# Patient Record
Sex: Male | Born: 1990 | Race: White | Hispanic: No | Marital: Single | Smoking: Former smoker
Health system: Southern US, Community
[De-identification: ages and names within clinical notes are randomized; demographics above are authoritative.]

## PROBLEM LIST (undated history)

## (undated) DIAGNOSIS — G43909 Migraine, unspecified, not intractable, without status migrainosus: Secondary | ICD-10-CM

## (undated) HISTORY — PX: KNEE SURGERY: SHX244

---
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
## 2014-11-05 ENCOUNTER — Ambulatory Visit
Admit: 2014-11-05 | Disposition: A | Discharge status: Home / Self Care | Payer: Self-pay | Attending: Family Medicine | Admitting: Family Medicine

## 2016-02-13 ENCOUNTER — Emergency Department: Payer: BLUE CROSS/BLUE SHIELD

## 2016-02-13 ENCOUNTER — Emergency Department
Admission: EM | Admit: 2016-02-13 | Discharge: 2016-02-13 | Disposition: A | Discharge status: Home / Self Care | Payer: BLUE CROSS/BLUE SHIELD | Attending: Emergency Medicine | Admitting: Emergency Medicine

## 2016-02-13 ENCOUNTER — Encounter: Payer: Self-pay | Admitting: Medical Oncology

## 2016-02-13 DIAGNOSIS — S161XXA Strain of muscle, fascia and tendon at neck level, initial encounter: Secondary | ICD-10-CM | POA: Insufficient documentation

## 2016-02-13 DIAGNOSIS — S60410A Abrasion of right index finger, initial encounter: Secondary | ICD-10-CM | POA: Diagnosis not present

## 2016-02-13 DIAGNOSIS — Y999 Unspecified external cause status: Secondary | ICD-10-CM | POA: Diagnosis not present

## 2016-02-13 DIAGNOSIS — Y9389 Activity, other specified: Secondary | ICD-10-CM | POA: Diagnosis not present

## 2016-02-13 DIAGNOSIS — Y92410 Unspecified street and highway as the place of occurrence of the external cause: Secondary | ICD-10-CM | POA: Insufficient documentation

## 2016-02-13 DIAGNOSIS — M7918 Myalgia, other site: Secondary | ICD-10-CM

## 2016-02-13 DIAGNOSIS — M542 Cervicalgia: Secondary | ICD-10-CM | POA: Diagnosis present

## 2016-02-13 MED ORDER — IBUPROFEN 600 MG PO TABS: 600.0000 mg | ORAL_TABLET | Freq: Three times a day (TID) | ORAL | 0 refills | Status: DC | PRN

## 2016-02-13 MED ORDER — METHOCARBAMOL 750 MG PO TABS: 750.0000 mg | ORAL_TABLET | Freq: Four times a day (QID) | ORAL | 0 refills | Status: DC

## 2016-02-13 MED ORDER — KETOROLAC TROMETHAMINE 60 MG/2ML IM SOLN
60.0000 mg | Freq: Once | INTRAMUSCULAR | Status: AC
  Administered 2016-02-13: 60 mg via INTRAMUSCULAR
  Filled 2016-02-13: qty 2

## 2016-02-13 MED ORDER — HYDROMORPHONE HCL 1 MG/ML IJ SOLN
1.0000 mg | Freq: Once | INTRAMUSCULAR | Status: AC
  Administered 2016-02-13: 1 mg via INTRAMUSCULAR
  Filled 2016-02-13: qty 1

## 2016-02-13 MED ORDER — BACITRACIN ZINC 500 UNIT/GM EX OINT: TOPICAL_OINTMENT | Freq: Two times a day (BID) | CUTANEOUS | Status: DC

## 2016-02-13 MED ORDER — TRAMADOL HCL 50 MG PO TABS: 50.0000 mg | ORAL_TABLET | Freq: Four times a day (QID) | ORAL | 0 refills | Status: AC | PRN
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
## 2016-02-13 NOTE — Discharge Instructions (Signed)
Weight thumb splint for 3-5 days as needed.

## 2016-02-13 NOTE — ED Notes (Signed)
Pt returned from Xray and CT.  
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
## 2023-10-31 ENCOUNTER — Other Ambulatory Visit: Payer: Self-pay | Admitting: Family Medicine

## 2023-10-31 DIAGNOSIS — M25561 Pain in right knee: Secondary | ICD-10-CM

## 2023-10-31 NOTE — Progress Notes (Signed)
 Chief Complaint  Patient presents with  . Leg Pain    Right leg.     Patient is agreeable to [employer] scribe.   History of Present Illness Garrett Costa is a 33 year old male with a history of knee surgeries who presents with right knee instability and pain following a fall.  He experienced a fall from a height of approximately 15 to 20 feet, resulting in right knee instability and pain. He does not recall the exact mechanism of injury to the knee but suspects landing on his right leg, as he is experiencing significant issues with his right knee and ankle.  He has a history of knee surgeries, including a meniscus repair and ACL reconstruction on the right knee. The current sensation is similar to previous instability experienced before his surgeries. The knee feels unstable, particularly when attempting to lift it, and it buckles outward. He is unable to perform certain activities such as washing his feet or putting on underwear without assistance due to the instability.  He visited the emergency room after a fall where x-rays and a CT scan of the head, neck, chest, abdomen, and pelvis were performed, which showed no significant findings. An MRI was not conducted due to unavailability. He was given a knee brace for stability.  He is currently taking ibuprofen  for pain management but prefers to avoid narcotics due to a history of substance use disorder, having been clean for two years. The ibuprofen  is somewhat effective, but he is open to trying stronger non-narcotic anti-inflammatories.  He also reports soreness and stiffness in the right ankle, which he believes to be sprained. No numbness or nerve pain in the foot or leg, although he initially described a sensation of cramping in the foot, which he attributes to swelling.  He mentions bruising on the right side of his body, which he associates with the fall, but denies any significant pain in the ribs or abdomen. The bruising is likely due to  the harness he was wearing during the fall.  When reviewing ER notes he did obtain CT head, cervical spine, chest, abdomen, and pelvis which were all reassuring.  X-rays of the knee and ankle were reassuring.  No x-ray of the tibia noted.    ROS  Review of systems is unremarkable for any active cardiac, respiratory, GI, GU, hematologic, neurologic, dermatologic, HEENT, or psychiatric symptoms except as noted above.  No fevers, chills, or constitutional symptoms.   Current Outpatient Medications  Medication Sig Dispense Refill  . acetaminophen  (TYLENOL ) 325 MG tablet Take 650 mg by mouth every 4 (four) hours as needed for Pain    . meloxicam (MOBIC) 15 MG tablet Take 1 tablet (15 mg total) by mouth once daily for 14 days 14 tablet 0   No current facility-administered medications for this visit.    Allergies as of 10/31/2023 - Reviewed 10/31/2023  Allergen Reaction Noted  . Topamax [topiramate] Nausea 11/14/2017    Patient Active Problem List  Diagnosis  . ADD (attention deficit disorder)  . Migraine with aura and without status migrainosus, not intractable  . Polysubstance abuse (CMS/HHS-HCC)    Past Medical History:  Diagnosis Date  . ADD (attention deficit disorder) 04/21/2014  . Migraine with aura and without status migrainosus, not intractable 09/01/2017    Past Surgical History:  Procedure Laterality Date  . finger surgery Left 2012  . KNEE SURGERY     x3  . wisdom teeth      Vitals:  10/31/23 1314  BP: 118/82  Pulse: 75  SpO2: 98%  Weight: (!) 142.4 kg (314 lb)  Height: 188 cm (6' 2)  PainSc: 10-Worst pain ever   Body mass index is 40.32 kg/m.  Exam BP 118/82 (BP Location: Left upper arm, Patient Position: Sitting, BP Cuff Size: Adult)   Pulse 75   Ht 188 cm (6' 2)   Wt (!) 142.4 kg (314 lb)   SpO2 98%   BMI 40.32 kg/m   General. Well appearing; NAD; VS reviewed     Eyes. Sclera and conjunctiva clear Lungs. Respirations unlabored; clear to  auscultation bilaterally Cardiovascular. Heart regular rate and rhythm without murmurs, gallops, or rubs MSK: Patient has 2+ pulses in the posterior tibialis bilaterally.  Has full range of motion of the right ankle.  Patient is able to extend and flex the knee but with manual assistance of his hands.  When palpating the knee there is some mild effusion noted on the joint line but no significant tenderness.  Anterior and posterior drawer test appears to be intact but does elicit some discomfort.  Some tenderness over the lateral and medial portions of the knee.  No significant bruising noted.  No notable instability with valgus and varus movement.  Patient has full range of motion of hip of the right leg. Skin. Normal color and turgor Neurologic. Alert and oriented x3 Assessment & Plan  Right knee instability and pain Right knee instability and pain following a fall from a height of 15-20 feet. ACL and meniscus repairs on the right knee. The knee is unstable, buckling, and unable to fully flex without assistance. Significant swelling present. Differential diagnosis includes possible meniscus tear, ACL injury, or hairline fracture. MRI is warranted to assess for structural damage. He prefers to avoid narcotics due to substance use disorder and is managing pain with ibuprofen . - Order MRI of the right knee to assess for structural damage. - Advise wearing a knee brace for stability until MRI results are available. - Switch from ibuprofen  to meloxicam for pain management. - Avoid opioids due to substance use disorder. - Consider lidocaine  patch if pain persists.  Right ankle sprain Right ankle sprain with soreness and stiffness following the same fall. X-rays at the emergency room showed no fractures. The ankle is sore but not as severe as the knee. Plan to rule out any missed fractures with further imaging. - Order x-ray of the tibia and fibula to rule out any missed fractures. - Continue with  anti-inflammatory medication for pain management.    F/U: Patient follow-up to be determined based on MRI.  JASON HESTLE WHITAKER, PA  This note has been created using automated tools and reviewed for accuracy by JASON HESTLE WHITAKER.   Note: This dictation was prepared with Dragon dictation along with smaller phrase technology. Any transcriptional errors that result from this process are unintentional.

## 2023-11-04 ENCOUNTER — Ambulatory Visit (HOSPITAL_COMMUNITY)
Admission: RE | Admit: 2023-11-04 | Discharge: 2023-11-04 | Disposition: A | Discharge status: Home / Self Care | Payer: Self-pay | Source: Ambulatory Visit | Attending: Family Medicine | Admitting: Family Medicine

## 2023-11-04 DIAGNOSIS — M25561 Pain in right knee: Secondary | ICD-10-CM | POA: Insufficient documentation
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
## 2024-02-12 ENCOUNTER — Other Ambulatory Visit: Payer: Self-pay

## 2024-02-12 ENCOUNTER — Other Ambulatory Visit: Payer: Self-pay | Admitting: Orthopedic Surgery

## 2024-02-12 ENCOUNTER — Encounter (HOSPITAL_COMMUNITY): Payer: Self-pay | Admitting: Orthopedic Surgery

## 2024-02-12 ENCOUNTER — Encounter (HOSPITAL_COMMUNITY): Payer: Self-pay

## 2024-02-12 ENCOUNTER — Inpatient Hospital Stay (HOSPITAL_COMMUNITY)
Admission: RE | Admit: 2024-02-12 | Discharge: 2024-02-18 | DRG: 486 | Disposition: A | Discharge status: Home Health | Payer: Worker's Compensation | Source: Ambulatory Visit | Attending: Orthopedic Surgery | Admitting: Orthopedic Surgery

## 2024-02-12 DIAGNOSIS — Z888 Allergy status to other drugs, medicaments and biological substances status: Secondary | ICD-10-CM

## 2024-02-12 DIAGNOSIS — S83511S Sprain of anterior cruciate ligament of right knee, sequela: Secondary | ICD-10-CM | POA: Diagnosis not present

## 2024-02-12 DIAGNOSIS — Y832 Surgical operation with anastomosis, bypass or graft as the cause of abnormal reaction of the patient, or of later complication, without mention of misadventure at the time of the procedure: Secondary | ICD-10-CM | POA: Diagnosis present

## 2024-02-12 DIAGNOSIS — Z9889 Other specified postprocedural states: Secondary | ICD-10-CM | POA: Diagnosis present

## 2024-02-12 DIAGNOSIS — E66813 Obesity, class 3: Secondary | ICD-10-CM | POA: Diagnosis present

## 2024-02-12 DIAGNOSIS — M009 Pyogenic arthritis, unspecified: Principal | ICD-10-CM | POA: Diagnosis present

## 2024-02-12 DIAGNOSIS — M00061 Staphylococcal arthritis, right knee: Secondary | ICD-10-CM | POA: Diagnosis present

## 2024-02-12 DIAGNOSIS — Z6841 Body Mass Index (BMI) 40.0 and over, adult: Secondary | ICD-10-CM | POA: Diagnosis not present

## 2024-02-12 DIAGNOSIS — T847XXA Infection and inflammatory reaction due to other internal orthopedic prosthetic devices, implants and grafts, initial encounter: Principal | ICD-10-CM | POA: Diagnosis present

## 2024-02-12 DIAGNOSIS — B958 Unspecified staphylococcus as the cause of diseases classified elsewhere: Secondary | ICD-10-CM

## 2024-02-12 DIAGNOSIS — B9689 Other specified bacterial agents as the cause of diseases classified elsewhere: Secondary | ICD-10-CM

## 2024-02-12 DIAGNOSIS — T8142XA Infection following a procedure, deep incisional surgical site, initial encounter: Secondary | ICD-10-CM | POA: Diagnosis present

## 2024-02-12 DIAGNOSIS — M00861 Arthritis due to other bacteria, right knee: Secondary | ICD-10-CM | POA: Diagnosis not present

## 2024-02-12 DIAGNOSIS — Z87891 Personal history of nicotine dependence: Secondary | ICD-10-CM

## 2024-02-12 DIAGNOSIS — Z969 Presence of functional implant, unspecified: Secondary | ICD-10-CM

## 2024-02-12 DIAGNOSIS — M1711 Unilateral primary osteoarthritis, right knee: Secondary | ICD-10-CM | POA: Diagnosis present

## 2024-02-12 HISTORY — DX: Migraine, unspecified, not intractable, without status migrainosus: G43.909

## 2024-02-12 LAB — CBC
HCT: 37.5 % — ABNORMAL LOW (ref 39.0–52.0)
Hemoglobin: 12 g/dL — ABNORMAL LOW (ref 13.0–17.0)
MCH: 29.6 pg (ref 26.0–34.0)
MCHC: 32 g/dL (ref 30.0–36.0)
MCV: 92.6 fL (ref 80.0–100.0)
Platelets: 533 K/uL — ABNORMAL HIGH (ref 150–400)
RBC: 4.05 MIL/uL — ABNORMAL LOW (ref 4.22–5.81)
RDW: 12.5 % (ref 11.5–15.5)
WBC: 9.1 K/uL (ref 4.0–10.5)
nRBC: 0 % (ref 0.0–0.2)

## 2024-02-12 LAB — COMPREHENSIVE METABOLIC PANEL WITH GFR
ALT: 56 U/L — ABNORMAL HIGH (ref 0–44)
AST: 36 U/L (ref 15–41)
Albumin: 3.1 g/dL — ABNORMAL LOW (ref 3.5–5.0)
Alkaline Phosphatase: 124 U/L (ref 38–126)
Anion gap: 12 (ref 5–15)
BUN: 14 mg/dL (ref 6–20)
CO2: 25 mmol/L (ref 22–32)
Calcium: 9.2 mg/dL (ref 8.9–10.3)
Chloride: 103 mmol/L (ref 98–111)
Creatinine, Ser: 0.92 mg/dL (ref 0.61–1.24)
GFR, Estimated: >60 mL/min (ref >60)
Glucose, Bld: 95 mg/dL (ref 70–99)
Potassium: 3.9 mmol/L (ref 3.5–5.1)
Sodium: 140 mmol/L (ref 135–145)
Total Bilirubin: 0.7 mg/dL (ref 0.0–1.2)
Total Protein: 8.1 g/dL (ref 6.5–8.1)

## 2024-02-12 LAB — SEDIMENTATION RATE: Sed Rate: 114 mm/h — ABNORMAL HIGH (ref 0–16)

## 2024-02-12 LAB — C-REACTIVE PROTEIN: CRP: 20.5 mg/dL — ABNORMAL HIGH (ref <1.0)

## 2024-02-12 MED ORDER — VANCOMYCIN HCL 2000 MG/400ML IV SOLN: 2000.0000 mg | Freq: Two times a day (BID) | INTRAVENOUS | Status: DC

## 2024-02-12 MED ORDER — VANCOMYCIN HCL 1500 MG/300ML IV SOLN
1500.0000 mg | Freq: Three times a day (TID) | INTRAVENOUS | Status: DC
  Administered 2024-02-13: 1500 mg via INTRAVENOUS
  Filled 2024-02-12 (×2): qty 300

## 2024-02-12 MED ORDER — OXYCODONE HCL 5 MG PO TABS
5.0000 mg | ORAL_TABLET | ORAL | Status: DC | PRN
  Administered 2024-02-12 – 2024-02-13 (×3): 10 mg via ORAL
  Filled 2024-02-12 (×3): qty 2

## 2024-02-12 MED ORDER — DOCUSATE SODIUM 100 MG PO CAPS
100.0000 mg | ORAL_CAPSULE | Freq: Two times a day (BID) | ORAL | Status: DC
  Administered 2024-02-12 – 2024-02-13 (×2): 100 mg via ORAL
  Filled 2024-02-12 (×3): qty 1

## 2024-02-12 MED ORDER — METHOCARBAMOL 1000 MG/10ML IJ SOLN: 500.0000 mg | Freq: Four times a day (QID) | INTRAMUSCULAR | Status: DC | PRN

## 2024-02-12 MED ORDER — SODIUM CHLORIDE 0.9 % IV SOLN: INTRAVENOUS | Status: DC

## 2024-02-12 MED ORDER — METHOCARBAMOL 500 MG PO TABS
500.0000 mg | ORAL_TABLET | Freq: Four times a day (QID) | ORAL | Status: DC | PRN
  Administered 2024-02-13 (×2): 500 mg via ORAL
  Filled 2024-02-12 (×2): qty 1

## 2024-02-12 MED ORDER — HYDROXYZINE HCL 25 MG PO TABS
25.0000 mg | ORAL_TABLET | Freq: Three times a day (TID) | ORAL | Status: DC
  Administered 2024-02-12 – 2024-02-18 (×26): 25 mg via ORAL
  Filled 2024-02-12 (×19): qty 1

## 2024-02-12 MED ORDER — SODIUM CHLORIDE 0.9 % IV SOLN
2.0000 g | Freq: Three times a day (TID) | INTRAVENOUS | Status: DC
  Administered 2024-02-12 – 2024-02-17 (×24): 2 g via INTRAVENOUS
  Filled 2024-02-12 (×17): qty 12.5

## 2024-02-12 MED ORDER — VANCOMYCIN HCL 10 G IV SOLR
2500.0000 mg | Freq: Once | INTRAVENOUS | Status: AC
  Administered 2024-02-12: 2500 mg via INTRAVENOUS
  Filled 2024-02-12: qty 10

## 2024-02-12 MED ORDER — ACETAMINOPHEN 325 MG PO TABS: 325.0000 mg | ORAL_TABLET | Freq: Four times a day (QID) | ORAL | Status: DC | PRN
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
## 2024-02-12 NOTE — Progress Notes (Addendum)
 Pharmacy Antibiotic Note  Garrett Costa is a 33 y.o. male admitted on 02/12/2024 with concerns for septic knee infection s/p ACL reconstruction on 01/05/24. Originally presented to [facility] ortho earlier today and underwent knee aspiration which revealed yellow colored joint fluid. Pharmacy has been consulted for Vancomycin  dosing.  Plan: - Cefepime  IV 2g Q8h - Vancomycin  plan 2500mg  x1, then 1500mg  q8h (eAUC 467, Scr rounded up to 0.8 (most recent was 0.7 on 02/09/24), IBW, Vd 0.5)  - Monitor renal function, clinical status - F/u plans for surgical intervention/source control, ability to de-escalate  Height: 6' 2 (188 cm) Weight: (!) 142.9 kg (315 lb) IBW/kg (Calculated) : 82.2  Temp (24hrs), Avg:98.5 F (36.9 C), Min:98.5 F (36.9 C), Max:98.5 F (36.9 C)  No results for input(s): WBC, CREATININE, LATICACIDVEN, VANCOTROUGH, VANCOPEAK, VANCORANDOM, GENTTROUGH, GENTPEAK, GENTRANDOM, TOBRATROUGH, TOBRAPEAK, TOBRARND, AMIKACINPEAK, AMIKACINTROU, AMIKACIN in the last 168 hours.  CrCl cannot be calculated (No successful lab value found.).    Allergies  Allergen Reactions   Topiramate Nausea Only    Antimicrobials this admission: Vanc/CFP 8/7>>  Dose adjustments this admission: none  Microbiology results: none  Thank you for allowing pharmacy to be a part of this patient's care.  Garrett Costa 02/12/2024 2:41 PM

## 2024-02-12 NOTE — H&P (Signed)
 Garrett Costa is an 33 y.o. male.   Chief Complaint: Right Knee Pain  HPI: Mr. Garrett Costa is a 33 year old male who is status post right knee redo ACL reconstruction utilizing a hamstring allograft through [employer] on 01/05/24.  Following this procedure the patient did have several aspirations of hemarthrosis and noticed continued swelling on the knee.  He reports that he has not been feeling well for the past 2 weeks.  He saw his primary care physician who did some blood work at a white blood cell count of 9K.  Patient reports he has had a fever up to 101.4 and his right knee is more painful and swollen today.  He presented to [facility] orthopedics earlier today where again the knee was aspirated unfortunately this time obtaining cloudy yellow colored joint fluid and there is concerns for a pyarthrosis.  He was asked to report to [facility] for admission and to have the knee washed out.  No past medical history on file.  Past Surgical History:  Procedure Laterality Date   KNEE SURGERY      No family history on file. Social History:  has no history on file for tobacco use, alcohol use, and drug use.  Allergies: No Known Allergies  No medications prior to admission.    No results found for this or any previous visit (from the past 48 hours). No results found.  Review of Systems  Constitutional:  Positive for fatigue and fever.  HENT: Negative.    Eyes: Negative.   Respiratory: Negative.    Cardiovascular: Negative.   Gastrointestinal: Negative.   Endocrine: Negative.   Genitourinary: Negative.   Musculoskeletal:  Positive for arthralgias, gait problem and joint swelling.  Allergic/Immunologic: Negative.   Hematological: Negative.   Psychiatric/Behavioral: Negative.      There were no vitals taken for this visit. Physical Exam Constitutional:      Appearance: Normal appearance. He is obese.  HENT:     Head: Normocephalic and atraumatic.     Nose: Nose normal.   Eyes:     Pupils: Pupils are equal, round, and reactive to light.  Cardiovascular:     Pulses: Normal pulses.  Pulmonary:     Effort: Pulmonary effort is normal.  Musculoskeletal:        General: Swelling and tenderness present.     Cervical back: Normal range of motion and neck supple.     Comments: He has a +2 effusion.  He has pain with motion.  Some warmth.  Surgical scars are benign.  Just a little erythema.  Skin:    General: Skin is warm and dry.  Neurological:     General: No focal deficit present.     Mental Status: He is alert and oriented to person, place, and time. Mental status is at baseline.  Psychiatric:        Mood and Affect: Mood normal.        Behavior: Behavior normal.        Thought Content: Thought content normal.        Judgment: Judgment normal.      Assessment/Plan Septic right knee in a patient with prior history of redo ACL reconstruction with hamstring allograft  This patient was discussed with Dr. Liam who also examined the patient and discussed options going forward.  At this point we do believe that a arthroscopic washout with possible removal of ACL graft and hardware would be indicated.  He will need IV  antibiotics and  we will consult infectious disease for definitive antibiotic treatment.  [name], PA-C 02/12/2024, 1:28 PM

## 2024-02-12 NOTE — Plan of Care (Signed)

## 2024-02-13 ENCOUNTER — Inpatient Hospital Stay (HOSPITAL_COMMUNITY): Payer: Worker's Compensation | Admitting: Anesthesiology

## 2024-02-13 ENCOUNTER — Encounter (HOSPITAL_COMMUNITY): Payer: Self-pay | Admitting: Orthopedic Surgery

## 2024-02-13 ENCOUNTER — Inpatient Hospital Stay (HOSPITAL_COMMUNITY): Admission: RE | Admit: 2024-02-13 | Source: Home / Self Care | Admitting: Orthopedic Surgery

## 2024-02-13 ENCOUNTER — Other Ambulatory Visit: Payer: Self-pay

## 2024-02-13 ENCOUNTER — Encounter (HOSPITAL_COMMUNITY)
Admission: RE | Disposition: A | Discharge status: Home Health | Payer: Self-pay | Source: Ambulatory Visit | Attending: Orthopedic Surgery

## 2024-02-13 DIAGNOSIS — M009 Pyogenic arthritis, unspecified: Secondary | ICD-10-CM

## 2024-02-13 DIAGNOSIS — E66813 Obesity, class 3: Secondary | ICD-10-CM

## 2024-02-13 DIAGNOSIS — Z6841 Body Mass Index (BMI) 40.0 and over, adult: Secondary | ICD-10-CM

## 2024-02-13 HISTORY — PX: KNEE ARTHROSCOPY: SHX127

## 2024-02-13 SURGERY — ARTHROSCOPY, KNEE
Anesthesia: General | Site: Knee | Laterality: Right

## 2024-02-13 MED ORDER — BISACODYL 5 MG PO TBEC: 5.0000 mg | DELAYED_RELEASE_TABLET | Freq: Every day | ORAL | Status: DC | PRN

## 2024-02-13 MED ORDER — HYDROGEN PEROXIDE 3 % EX SOLN
CUTANEOUS | Status: AC
  Filled 2024-02-13: qty 473

## 2024-02-13 MED ORDER — METOCLOPRAMIDE HCL 5 MG/ML IJ SOLN: 5.0000 mg | Freq: Three times a day (TID) | INTRAMUSCULAR | Status: DC | PRN

## 2024-02-13 MED ORDER — HYDROMORPHONE HCL 1 MG/ML IJ SOLN
0.5000 mg | INTRAMUSCULAR | Status: DC | PRN
  Administered 2024-02-13 – 2024-02-17 (×13): 1 mg via INTRAVENOUS
  Filled 2024-02-13 (×10): qty 1

## 2024-02-13 MED ORDER — KETOROLAC TROMETHAMINE 30 MG/ML IJ SOLN: 30.0000 mg | Freq: Once | INTRAMUSCULAR | Status: DC | PRN

## 2024-02-13 MED ORDER — METOCLOPRAMIDE HCL 5 MG PO TABS: 5.0000 mg | ORAL_TABLET | Freq: Three times a day (TID) | ORAL | Status: DC | PRN

## 2024-02-13 MED ORDER — PROPOFOL 10 MG/ML IV BOLUS
INTRAVENOUS | Status: AC
  Filled 2024-02-13: qty 20

## 2024-02-13 MED ORDER — LIDOCAINE HCL (PF) 2 % IJ SOLN
INTRAMUSCULAR | Status: AC
  Filled 2024-02-13: qty 5

## 2024-02-13 MED ORDER — AMISULPRIDE (ANTIEMETIC) 5 MG/2ML IV SOLN: 10.0000 mg | Freq: Once | INTRAVENOUS | Status: DC | PRN

## 2024-02-13 MED ORDER — ONDANSETRON HCL 4 MG/2ML IJ SOLN: 4.0000 mg | Freq: Four times a day (QID) | INTRAMUSCULAR | Status: DC | PRN

## 2024-02-13 MED ORDER — DEXAMETHASONE SODIUM PHOSPHATE 4 MG/ML IJ SOLN
INTRAMUSCULAR | Status: DC | PRN
  Administered 2024-02-13: 10 mg via INTRAVENOUS

## 2024-02-13 MED ORDER — EPINEPHRINE PF 1 MG/ML IJ SOLN
INTRAMUSCULAR | Status: DC | PRN
  Administered 2024-02-13: 6000 mL

## 2024-02-13 MED ORDER — FENTANYL CITRATE (PF) 100 MCG/2ML IJ SOLN
INTRAMUSCULAR | Status: AC
  Filled 2024-02-13: qty 2

## 2024-02-13 MED ORDER — ONDANSETRON HCL 4 MG/2ML IJ SOLN
INTRAMUSCULAR | Status: DC | PRN
  Administered 2024-02-13: 4 mg via INTRAVENOUS

## 2024-02-13 MED ORDER — MIDAZOLAM HCL 5 MG/5ML IJ SOLN
INTRAMUSCULAR | Status: DC | PRN
  Administered 2024-02-13: 2 mg via INTRAVENOUS

## 2024-02-13 MED ORDER — MIDAZOLAM HCL 2 MG/2ML IJ SOLN
INTRAMUSCULAR | Status: AC
  Filled 2024-02-13: qty 2

## 2024-02-13 MED ORDER — ACETAMINOPHEN 325 MG PO TABS
325.0000 mg | ORAL_TABLET | Freq: Four times a day (QID) | ORAL | Status: DC | PRN
  Administered 2024-02-16: 650 mg via ORAL
  Administered 2024-02-16: 325 mg via ORAL
  Administered 2024-02-16: 650 mg via ORAL
  Administered 2024-02-16: 325 mg via ORAL
  Administered 2024-02-17 (×2): 650 mg via ORAL
  Filled 2024-02-13 (×3): qty 2

## 2024-02-13 MED ORDER — HYDROMORPHONE HCL 2 MG/ML IJ SOLN
INTRAMUSCULAR | Status: AC
Start: 2024-02-13 — End: 2024-02-13
  Filled 2024-02-13: qty 1

## 2024-02-13 MED ORDER — ACETAMINOPHEN 10 MG/ML IV SOLN: 1000.0000 mg | Freq: Once | INTRAVENOUS | Status: DC | PRN

## 2024-02-13 MED ORDER — KCL IN DEXTROSE-NACL 20-5-0.45 MEQ/L-%-% IV SOLN
INTRAVENOUS | Status: DC
  Filled 2024-02-13 (×4): qty 1000

## 2024-02-13 MED ORDER — BUPIVACAINE HCL (PF) 0.5 % IJ SOLN
INTRAMUSCULAR | Status: AC
  Filled 2024-02-13: qty 30

## 2024-02-13 MED ORDER — LIDOCAINE HCL (CARDIAC) PF 100 MG/5ML IV SOSY
PREFILLED_SYRINGE | INTRAVENOUS | Status: DC | PRN
  Administered 2024-02-13: 60 mg via INTRAVENOUS

## 2024-02-13 MED ORDER — OXYCODONE HCL 5 MG/5ML PO SOLN: 5.0000 mg | Freq: Once | ORAL | Status: DC | PRN

## 2024-02-13 MED ORDER — ASPIRIN 81 MG PO TBEC
81.0000 mg | DELAYED_RELEASE_TABLET | Freq: Two times a day (BID) | ORAL | Status: DC
Start: 2024-02-13 — End: 2024-02-18
  Administered 2024-02-13 – 2024-02-18 (×15): 81 mg via ORAL
  Filled 2024-02-13 (×10): qty 1

## 2024-02-13 MED ORDER — EPINEPHRINE PF 1 MG/ML IJ SOLN
INTRAMUSCULAR | Status: AC
  Filled 2024-02-13: qty 2

## 2024-02-13 MED ORDER — PROPOFOL 10 MG/ML IV BOLUS
INTRAVENOUS | Status: DC | PRN
  Administered 2024-02-13: 250 mg via INTRAVENOUS

## 2024-02-13 MED ORDER — OXYCODONE HCL 5 MG PO TABS: 5.0000 mg | ORAL_TABLET | Freq: Once | ORAL | Status: DC | PRN

## 2024-02-13 MED ORDER — VANCOMYCIN HCL 2000 MG/400ML IV SOLN
2000.0000 mg | Freq: Two times a day (BID) | INTRAVENOUS | Status: DC
  Administered 2024-02-14 – 2024-02-16 (×6): 2000 mg via INTRAVENOUS
  Filled 2024-02-13 (×7): qty 400

## 2024-02-13 MED ORDER — SENNOSIDES-DOCUSATE SODIUM 8.6-50 MG PO TABS: 1.0000 | ORAL_TABLET | Freq: Every evening | ORAL | Status: DC | PRN

## 2024-02-13 MED ORDER — HYDROMORPHONE HCL 1 MG/ML IJ SOLN
INTRAMUSCULAR | Status: DC | PRN
  Administered 2024-02-13 (×2): 1 mg via INTRAVENOUS

## 2024-02-13 MED ORDER — ONDANSETRON HCL 4 MG/2ML IJ SOLN
INTRAMUSCULAR | Status: AC
Start: 2024-02-13 — End: 2024-02-13
  Filled 2024-02-13: qty 2

## 2024-02-13 MED ORDER — DOCUSATE SODIUM 100 MG PO CAPS
100.0000 mg | ORAL_CAPSULE | Freq: Two times a day (BID) | ORAL | Status: DC
  Administered 2024-02-13 – 2024-02-18 (×15): 100 mg via ORAL
  Filled 2024-02-13 (×10): qty 1

## 2024-02-13 MED ORDER — FENTANYL CITRATE (PF) 100 MCG/2ML IJ SOLN
INTRAMUSCULAR | Status: DC | PRN
  Administered 2024-02-13: 100 ug via INTRAVENOUS
  Administered 2024-02-13 (×2): 50 ug via INTRAVENOUS

## 2024-02-13 MED ORDER — FENTANYL CITRATE PF 50 MCG/ML IJ SOSY: 25.0000 ug | PREFILLED_SYRINGE | INTRAMUSCULAR | Status: DC | PRN

## 2024-02-13 MED ORDER — LACTATED RINGERS IV SOLN: INTRAVENOUS | Status: DC | PRN

## 2024-02-13 MED ORDER — ONDANSETRON HCL 4 MG PO TABS: 4.0000 mg | ORAL_TABLET | Freq: Four times a day (QID) | ORAL | Status: DC | PRN

## 2024-02-13 MED ORDER — FENTANYL CITRATE (PF) 100 MCG/2ML IJ SOLN
INTRAMUSCULAR | Status: AC
Start: 2024-02-13 — End: 2024-02-13
  Filled 2024-02-13: qty 2

## 2024-02-13 MED ORDER — ACETAMINOPHEN 500 MG PO TABS
1000.0000 mg | ORAL_TABLET | Freq: Four times a day (QID) | ORAL | Status: AC
  Administered 2024-02-13 – 2024-02-14 (×4): 1000 mg via ORAL
  Filled 2024-02-13 (×4): qty 2

## 2024-02-13 MED ORDER — DEXMEDETOMIDINE HCL IN NACL 80 MCG/20ML IV SOLN
INTRAVENOUS | Status: DC | PRN
Start: 2024-02-13 — End: 2024-02-13
  Administered 2024-02-13: 8 ug via INTRAVENOUS
  Administered 2024-02-13: 12 ug via INTRAVENOUS

## 2024-02-13 MED ORDER — OXYCODONE HCL 5 MG PO TABS
5.0000 mg | ORAL_TABLET | ORAL | Status: DC | PRN
  Administered 2024-02-13 – 2024-02-18 (×24): 10 mg via ORAL
  Administered 2024-02-18: 5 mg via ORAL
  Administered 2024-02-18 (×3): 10 mg via ORAL
  Administered 2024-02-18: 5 mg via ORAL
  Administered 2024-02-18: 10 mg via ORAL
  Filled 2024-02-13 (×20): qty 2

## 2024-02-13 MED ORDER — TRAMADOL HCL 50 MG PO TABS
50.0000 mg | ORAL_TABLET | Freq: Four times a day (QID) | ORAL | Status: DC
  Administered 2024-02-13 – 2024-02-18 (×26): 50 mg via ORAL
  Filled 2024-02-13 (×17): qty 1

## 2024-02-13 MED ORDER — DEXMEDETOMIDINE HCL IN NACL 80 MCG/20ML IV SOLN
INTRAVENOUS | Status: AC
  Filled 2024-02-13: qty 20

## 2024-02-13 MED ORDER — CEFAZOLIN SODIUM-DEXTROSE 2-4 GM/100ML-% IV SOLN: 2.0000 g | INTRAVENOUS | Status: DC

## 2024-02-13 SURGICAL SUPPLY — 39 items
BAG COUNTER SPONGE SURGICOUNT (BAG) IMPLANT
BAG ZIPLOCK 12X15 (MISCELLANEOUS) ×1 IMPLANT
BLADE EXCALIBUR 4.0X13 (MISCELLANEOUS) IMPLANT
BLADE SURG SZ11 CARB STEEL (BLADE) IMPLANT
BNDG ELASTIC 4X5.8 VLCR NS LF (GAUZE/BANDAGES/DRESSINGS) IMPLANT
BNDG ELASTIC 6INX 5YD STR LF (GAUZE/BANDAGES/DRESSINGS) IMPLANT
CHLORAPREP W/TINT 26 (MISCELLANEOUS) ×1 IMPLANT
COVER SURGICAL LIGHT HANDLE (MISCELLANEOUS) ×1 IMPLANT
DISSECTOR 4.0MM X 13CM (MISCELLANEOUS) ×1 IMPLANT
DRAPE FOOT SWITCH (DRAPES) ×1 IMPLANT
DRAPE SHEET LG 3/4 BI-LAMINATE (DRAPES) ×1 IMPLANT
GAUZE 4X4 16PLY ~~LOC~~+RFID DBL (SPONGE) ×1 IMPLANT
GAUZE PAD ABD 8X10 STRL (GAUZE/BANDAGES/DRESSINGS) ×1 IMPLANT
GAUZE SPONGE 4X4 12PLY STRL (GAUZE/BANDAGES/DRESSINGS) ×1 IMPLANT
GAUZE XEROFORM 1X8 LF (GAUZE/BANDAGES/DRESSINGS) ×1 IMPLANT
GLOVE BIO SURGEON STRL SZ7.5 (GLOVE) ×1 IMPLANT
GLOVE BIO SURGEON STRL SZ8.5 (GLOVE) ×1 IMPLANT
GLOVE BIOGEL PI IND STRL 8 (GLOVE) ×1 IMPLANT
GLOVE BIOGEL PI IND STRL 9 (GLOVE) ×1 IMPLANT
GOWN STRL REUS W/ TWL XL LVL3 (GOWN DISPOSABLE) ×2 IMPLANT
IRRIGATION SUCT STRKRFLW 2 WTP (MISCELLANEOUS) ×1 IMPLANT
KIT BASIN OR (CUSTOM PROCEDURE TRAY) IMPLANT
KIT TURNOVER KIT A (KITS) ×1 IMPLANT
MANIFOLD NEPTUNE II (INSTRUMENTS) ×1 IMPLANT
NDL HYPO 22X1.5 SAFETY MO (MISCELLANEOUS) ×1 IMPLANT
NDL SAFETY ECLIPSE 18X1.5 (NEEDLE) ×1 IMPLANT
NDL SPNL 18GX3.5 QUINCKE PK (NEEDLE) ×1 IMPLANT
NEEDLE HYPO 22X1.5 SAFETY MO (MISCELLANEOUS) ×1 IMPLANT
NEEDLE SPNL 18GX3.5 QUINCKE PK (NEEDLE) ×1 IMPLANT
PACK ARTHROSCOPY WL (CUSTOM PROCEDURE TRAY) ×1 IMPLANT
PADDING CAST COTTON 6X4 STRL (CAST SUPPLIES) ×1 IMPLANT
PROTECTOR NERVE ULNAR (MISCELLANEOUS) ×1 IMPLANT
SPONGE DRAIN TRACH 4X4 STRL 2S (GAUZE/BANDAGES/DRESSINGS) IMPLANT
SPONGE T-LAP 4X18 ~~LOC~~+RFID (SPONGE) ×1 IMPLANT
SYR 20ML LL LF (SYRINGE) ×1 IMPLANT
SYR BULB IRRIG 60ML STRL (SYRINGE) ×1 IMPLANT
TOWEL OR 17X26 10 PK STRL BLUE (TOWEL DISPOSABLE) ×1 IMPLANT
TUBING ARTHROSCOPY IRRIG 16FT (MISCELLANEOUS) ×1 IMPLANT
WRAP KNEE MAXI GEL POST OP (GAUZE/BANDAGES/DRESSINGS) ×1 IMPLANT

## 2024-02-13 NOTE — Progress Notes (Signed)
 Pharmacy Antibiotic Note  Garrett Costa is a 33 y.o. male admitted on 02/12/2024 with concerns for septic knee infection s/p ACL reconstruction on 01/05/24. Presented to [facility] yesterday, 02/12/24, and underwent knee aspiration which revealed yellow colored joint fluid. Patient admitted for knee irrigation. Of note, patient received cefadroxil 7/17-8/5, was prescribed Levaquin 8/4, and Bactrim 8/6. Pharmacy has been consulted for Vancomycin  dosing.  Plan: - Vancomycin  2000 mg IV q12h (eAUC 475, Scr 0.92, IBW, Vd 0.5)  - Monitor renal function, clinical status - F/u cultures for de-escalation   Height: 6' 2 (188 cm) Weight: (!) 142.9 kg (315 lb) IBW/kg (Calculated) : 82.2  Temp (24hrs), Avg:98.5 F (36.9 C), Min:97.9 F (36.6 C), Max:98.9 F (37.2 C)  Recent Labs  Lab 02/12/24 1409  WBC 9.1  CREATININE 0.92    Estimated Creatinine Clearance: 176.9 mL/min (by C-G formula based on SCr of 0.92 mg/dL).    Allergies  Allergen Reactions   Topiramate Nausea Only    Antimicrobials this admission: Vanc 8/7>> CFP 8/7>>  Dose adjustments this admission: Vancomycin  1500 mg IV q8h > Vancomycin  2000 mg IV q12h  Microbiology results: 8/7 bcx: pending  Thank you for allowing pharmacy to be a part of this patient's care.  Izetta Carl, PharmD PGY1 Pharmacy Resident [facility]  02/13/2024 8:48 AM

## 2024-02-13 NOTE — Interval H&P Note (Signed)
 History and Physical Interval Note:  02/13/2024 5:35 PM  Garrett Costa  has presented today for surgery, with the diagnosis of Infected Right ACL Reconstruction.  The various methods of treatment have been discussed with the patient and family. After consideration of risks, benefits and other options for treatment, the patient has consented to  Procedure(s) with comments: ARTHROSCOPY, KNEE (Right) - Arthroscopy, Irrigation and Debridement of Right knee, Possible Removal Of Infected Graft as a surgical intervention.  The patient's history has been reviewed, patient examined, no change in status, stable for surgery.  I have reviewed the patient's chart and labs.  Questions were answered to the patient's satisfaction.     Dempsey JINNY Sensor

## 2024-02-13 NOTE — Transfer of Care (Signed)
 Immediate Anesthesia Transfer of Care Note  Patient: Garrett Costa  Procedure(s) Performed: ARTHROSCOPY, KNEE (Right: Knee)  Patient Location: PACU  Anesthesia Type:General  Level of Consciousness: drowsy and patient cooperative  Airway & Oxygen Therapy: Patient Spontanous Breathing and Patient connected to face mask oxygen  Post-op Assessment: Report given to RN and Post -op Vital signs reviewed and stable  Post vital signs: Reviewed and stable  Last Vitals:  Vitals Value Taken Time  BP 154/94 02/13/24 19:38  Temp    Pulse 96 02/13/24 19:39  Resp 20 02/13/24 19:39  SpO2 91 % 02/13/24 19:39  Vitals shown include unfiled device data.  Last Pain:  Vitals:   02/13/24 1718  TempSrc:   PainSc: 7       Patients Stated Pain Goal: 3 (02/13/24 0054)  Complications: No notable events documented.

## 2024-02-13 NOTE — Anesthesia Preprocedure Evaluation (Addendum)
 Anesthesia Evaluation  Patient identified by MRN, date of birth, ID band Patient awake    Reviewed: Allergy & Precautions, NPO status , Patient's Chart, lab work & pertinent test results  Airway Mallampati: II  TM Distance: >3 FB Neck ROM: Full    Dental no notable dental hx.    Pulmonary former smoker   Pulmonary exam normal        Cardiovascular negative cardio ROS Normal cardiovascular exam     Neuro/Psych  Headaches  negative psych ROS   GI/Hepatic negative GI ROS, Neg liver ROS,,,  Endo/Other    Class 3 obesity  Renal/GU negative Renal ROS  negative genitourinary   Musculoskeletal  (+) Arthritis ,  Septic arthritis right knee S/P ACL reconstruction   Abdominal  (+) + obese  Peds  Hematology negative hematology ROS (+) Blood dyscrasia, anemia   Anesthesia Other Findings Infected Right ACL Reconstruction  Reproductive/Obstetrics                              Anesthesia Physical Anesthesia Plan  ASA: 3  Anesthesia Plan: General   Post-op Pain Management:    Induction: Intravenous  PONV Risk Score and Plan: 3 and Treatment may vary due to age or medical condition, Midazolam  and Ondansetron   Airway Management Planned: LMA  Additional Equipment:   Intra-op Plan:   Post-operative Plan: Extubation in OR  Informed Consent: I have reviewed the patients History and Physical, chart, labs and discussed the procedure including the risks, benefits and alternatives for the proposed anesthesia with the patient or authorized representative who has indicated his/her understanding and acceptance.     Dental advisory given  Plan Discussed with: CRNA  Anesthesia Plan Comments:          Anesthesia Quick Evaluation

## 2024-02-13 NOTE — Anesthesia Procedure Notes (Signed)
 Procedure Name: LMA Insertion Date/Time: 02/13/2024 6:12 PM  Performed by: Vincenzo Show, CRNAPre-anesthesia Checklist: Patient identified, Emergency Drugs available, Suction available, Patient being monitored and Timeout performed Patient Re-evaluated:Patient Re-evaluated prior to induction Oxygen Delivery Method: Circle system utilized Preoxygenation: Pre-oxygenation with 100% oxygen Induction Type: IV induction Ventilation: Mask ventilation without difficulty LMA: LMA with gastric port inserted LMA Size: 5.0 Number of attempts: 1 Placement Confirmation: positive ETCO2 and breath sounds checked- equal and bilateral Tube secured with: Tape Dental Injury: Teeth and Oropharynx as per pre-operative assessment  Comments: Easy pass LMA.

## 2024-02-13 NOTE — Progress Notes (Addendum)
   Date of Admission:  02/12/2024      ID: Garrett Costa is a 33 y.o. male Principal Problem:   Septic arthritis of knee, right (HCC) Active Problems:   S/P reconstruction of ACL of right knee using hamstring autograft   Retained orthopedic hardware    Subjective: No specific complaints Waiting to go to the OR  family at bedside  Medications:   docusate sodium   100 mg Oral BID   hydrOXYzine   25 mg Oral TID    Objective: Vital signs in last 24 hours: Patient Vitals for the past 24 hrs:  BP Temp Temp src Pulse Resp SpO2  02/13/24 1413 (!) 147/92 99.5 F (37.5 C) Oral 97 19 100 %  02/13/24 0627 118/78 97.9 F (36.6 C) Oral 85 16 94 %  02/13/24 0229 132/78 98.4 F (36.9 C) Oral 92 16 96 %  02/12/24 2003 139/70 98.8 F (37.1 C) Oral 85 17 99 %  02/12/24 1900 -- 98.9 F (37.2 C) Oral -- -- --    PHYSICAL EXAM:  In no distress Right knee swelling Lab Results    Latest Ref Rng & Units 02/12/2024    2:09 PM  CBC  WBC 4.0 - 10.5 K/uL 9.1   Hemoglobin 13.0 - 17.0 g/dL 87.9   Hematocrit 60.9 - 52.0 % 37.5   Platelets 150 - 400 K/uL 533        Latest Ref Rng & Units 02/12/2024    2:09 PM  CMP  Glucose 70 - 99 mg/dL 95   BUN 6 - 20 mg/dL 14   Creatinine 9.38 - 1.24 mg/dL 9.07   Sodium 864 - 854 mmol/L 140   Potassium 3.5 - 5.1 mmol/L 3.9   Chloride 98 - 111 mmol/L 103   CO2 22 - 32 mmol/L 25   Calcium 8.9 - 10.3 mg/dL 9.2   Total Protein 6.5 - 8.1 g/dL 8.1   Total Bilirubin 0.0 - 1.2 mg/dL 0.7   Alkaline Phos 38 - 126 U/L 124   AST 15 - 41 U/L 36   ALT 0 - 44 U/L 56       Awaiting  culture results from the aspiration of the knee done in Ortho office.   Assessment/Plan: ? Right knee septic arthritis following ACL tear repair with allograft. Patient has been on p.o. antibiotics since 01/22/2024 initially with cefadroxil for 15 days and then on Levaquin and Bactrim. He has fever and night sweats He is going for arthroscopic washout today. Discussed with Ortho PA  regarding sending the tissue cultures for bacterial, fungal and AFB. Very likely the hardware will have to be removed because of ongoing fever Continue vancomycin  and cefepime  until cultures are available before plan can be made for discharge antibiotics .  On-call ID available for urgent questions over the weekend.

## 2024-02-13 NOTE — TOC Initial Note (Signed)
 Transition of Care [facility]) - Initial/Assessment Note    Patient Details  Name: Garrett Costa MRN: [id number] Date of Birth: December 26, 1990  [facility] of [facility]) CM/SW Contact:    NORMAN ASPEN, LCSW Phone Number: 02/13/2024, 3:41 PM  Clinical Narrative:                  Met with pt today to review anticipated dc needs of possible DME and IV abx.  Pt confirms he is covered under a Workers Education officer, environmental and is [employer] case Production designer, theatre/television/film is Lynwood Daring @ [phone].  Spoke with WC CM who notes he will be primary contact and to please be in touch with him once orders are in for any follow up services, DME, etc.  Will alert weekend TOC coverage.   Expected Discharge Plan: Home w Home Health Services Barriers to Discharge: Continued Medical Work up   Patient Goals and CMS Choice Patient states their goals for this hospitalization and ongoing recovery are:: return home          Expected Discharge Plan and Services       Living arrangements for the past 2 months: Single Family Home                                      Prior Living Arrangements/Services Living arrangements for the past 2 months: Single Family Home Lives with:: Spouse Patient language and need for interpreter reviewed:: Yes Do you feel safe going back to the place where you live?: Yes        Care giver support system in place?: Yes (comment)   Criminal Activity/Legal Involvement Pertinent to Current Situation/Hospitalization: No - Comment as needed  Activities of Daily Living   ADL Screening (condition at time of admission) Independently performs ADLs?: Yes (appropriate for developmental age) Is the patient deaf or have difficulty hearing?: No Does the patient have difficulty seeing, even when wearing glasses/contacts?: No Does the patient have difficulty concentrating, remembering, or making decisions?: No  Permission Sought/Granted Permission sought to share information with : Family Supports, Case  Manager Permission granted to share information with : Yes, Verbal Permission Granted  Share Information with NAME: spouse, Joshia Kitchings  Permission granted to share info w AGENCY: Workers Comp Case Manager - Lynwood Daring @ [phone]        Emotional Assessment Appearance:: Appears stated age   Affect (typically observed): Pleasant Orientation: : Oriented to Self, Oriented to Place, Oriented to  Time, Oriented to Situation Alcohol / Substance Use: Not Applicable Psych Involvement: No (comment)  Admission diagnosis:  Septic arthritis of knee, right [facility]) [M00.9] Patient Active Problem List   Diagnosis Date Noted   Septic arthritis of knee, right (HCC) 02/12/2024   S/P reconstruction of ACL of right knee using hamstring autograft 02/12/2024   Retained orthopedic hardware 02/12/2024   PCP:  Cleotilde Oneil FALCON, MD Pharmacy:   [facility] [phone] - [place], [place] - [address] AT [facility] [address] [place] [address] Phone: [phone] Fax: [phone]     Social Drivers of Health (SDOH) Social History: SDOH Screenings   Food Insecurity: No Food Insecurity (02/12/2024)  Housing: Unknown (02/12/2024)  Transportation Needs: No Transportation Needs (02/12/2024)  Utilities: Not At Risk (02/12/2024)  Financial Resource Strain: Low Risk  (03/04/2023)   Received from [facility]  Social Connections: Unknown (11/20/2021)  Received from [facility]  Tobacco Use: Medium Risk (02/13/2024)   SDOH Interventions:     Readmission Risk Interventions    02/13/2024    3:38 PM  Readmission Risk Prevention Plan  Post Dischage Appt Complete  Medication Screening Complete  Transportation Screening Complete

## 2024-02-13 NOTE — Op Note (Signed)
 Pre-Op Dx: Suspected prior arthrosis right knee, right proximal tibial soft tissue abscess s/p revision right ACL reconstruction 01/07/2024  Postop Dx: Same  Procedure: Arthroscopic irrigation and debridement of right knee with 3 compartment synovectomy.  Irrigation and debridement of proximal right tibial abscess that went down to the Endobutton on the tibial tunnel.  Surgeon: Dempsey PARAS. Liam M.D.  Assist: Camellia POUR. Orlando RIGGERS  (present throughout entire procedure and necessary for timely completion of the procedure) Anes: General LMA  EBL: Minimal  Fluids: 800 cc  Specimens: From the tibial abscess and the right knee synovial fluid we sent specimens for Gram stain's cultures acid-fast bacillus and fungus.  Indications: 33 year old man underwent revision right ACL reconstruction on 01/07/2024.  He had a significant postoperative hemarthrosis that was aspirated with 95 cc of blood the next day.  He had a repeat aspiration 5 days later and a third aspiration approximately a week after that.  2 weeks ago he had a fourth aspiration that yielded cloudy fluid that was sent off for Gram stain and culture with a cell count of 44,098% polys and the cultures grew nothing.  At that time he was placed on cefadroxil.  Because of persistent pain and swelling and fevers up to 101.5 his PCP placed him on Septra and Levaquin last week.  He returned to the office yesterday and had 1/5 aspiration yielding 80 cc of pus.  This was also sent for Gram stain and culture and as of 2 hours ago the only result we had was that there were many polys.  The patient was admitted yesterday for IV antibiotics infectious diseases was consulted and placed him on vancomycin  and cefepime .  When I saw him in the preop area he was febrile with a temperature of 101.  He is taken for arthroscopic irrigation debridement.  If the graft is intact it will be left in place if it is significantly damaged the graft and the hardware will be removed.  This  plan was discussed with his primary surgeon Dr. Yvone as well as our sports medicine specialist Drs. Dalldorf and Wham.  Procedure: Patient identified by arm band and taken to the operating room at the day surgery Center. The appropriate anesthetic monitors were attached, and General LMA anesthesia was induced without difficulty. Lateral post was applied to the table and the lower extremity was prepped and draped in usual sterile fashion from the ankle to the midthigh.  A tourniquet was placed high on the right thigh but not used.  Time out procedure was performed. We began the operation by making standard inferior lateral and inferior medial peripatellar portals with a #11 blade allowing introduction of the arthroscope through the inferior lateral portal.  We removed the cannula and 80 cc of purulent material came out and was sent off for Gram stain culture AFB and fungus.  An outflow cannula was placed through the inferomedial portal. Pump pressure was set at 50 mmHg.  We could visualize through the scope and that the cannulas were in the knee however there was no outflow secondary to clotted material blocking the outflow cannula.  The pressure was temporarily increased to 80 mm and the clot was pushed through the outflow cannula we were able to establish continuous flow.  There was a great deal of fibrinous material inflamed synovium and blood clot in the suprapatellar pouch medial and lateral gutters as well as the medial and lateral compartments.  This was removed using the Arthrex 4.0 mm Excalibur sucker  shaver and approximately 6 L of fluid running continuously.  We got good visualization of the ACL graft which was intact and 1+ laxity probing.  At this time we paused the arthroscopic procedure and I directed our attention to the medial proximal tibial incision that had been used to place the tibial tunnel.  Using an 18-gauge needle we aspirated through the incision and obtained grayish purulent material.   This was also sent off for Gram stain and culture, AFB and fungus.  We then recreated the proximal tibial incision and cut end to an abscess that was 1 inch in length and 1 inch wide.  This is thoroughly decompressed using a gloved finger and irrigated out copiously with normal saline solution.  We we alternated irrigation with normal saline and hydrogen peroxide  in the abscess. We are able to identify the button at the distal end of the tibial tunnel as well as the knot coming through the button holding the graft.  Using the arthroscope to visualize we then applied tension to the suture coming through the tibial button and we could see the graft tighten as we pulled on the suture.  It moved maybe 1 mm.  With this in mind it was felt that the allograft had not grown into the tibial tunnel and the suture and button were necessary at this time for fixation.  The button was left in place.  We then completed our synovectomy inside the knee and placed high double-armed Hemovac in the portals.  These were sewn into place.  A third Hemovac was then placed into the abscess, and then incision was loosely closed with 2-0 vertical mattress suture nylon.  A dressing of Xeroform 4 x 4 dressing sponges Webril and Ace wrap applied the patient was awakened and taken to the recovery room without difficulty.  The plan is to leave the Hemovacs in place for a couple of days and then if the fluid coming out is clear remove the Hemovac drains on the third day.  Infectious diseases will be consulted for antibiotic therapy going forward the assumption is it will be IV antibiotics for a number of weeks followed by oral antibiotics.  Hopefully we will identify the organism.   Signed: Dempsey JINNY Sensor, MD

## 2024-02-13 NOTE — Plan of Care (Signed)

## 2024-02-14 LAB — BASIC METABOLIC PANEL WITH GFR
Anion gap: 13 (ref 5–15)
BUN: 13 mg/dL (ref 6–20)
CO2: 24 mmol/L (ref 22–32)
Calcium: 9.2 mg/dL (ref 8.9–10.3)
Chloride: 101 mmol/L (ref 98–111)
Creatinine, Ser: 0.77 mg/dL (ref 0.61–1.24)
GFR, Estimated: >60 mL/min (ref >60)
Glucose, Bld: 199 mg/dL — ABNORMAL HIGH (ref 70–99)
Potassium: 4.8 mmol/L (ref 3.5–5.1)
Sodium: 138 mmol/L (ref 135–145)

## 2024-02-14 MED ORDER — CELECOXIB 200 MG PO CAPS
200.0000 mg | ORAL_CAPSULE | Freq: Two times a day (BID) | ORAL | Status: DC
  Administered 2024-02-14 – 2024-02-18 (×15): 200 mg via ORAL
  Filled 2024-02-14 (×10): qty 1

## 2024-02-14 NOTE — Progress Notes (Signed)
 PATIENT ID: Garrett Costa  MRN: [id number]  DOB/AGE:  [birth year] / 33 y.o.  1 Day Post-Op Procedure(s) (LRB): ARTHROSCOPY, KNEE (Right)    PROGRESS NOTE Subjective:   Patient is alert, oriented, afebrile, no Nausea, no Vomiting, yes passing gas, no Bowel Movement. Taking PO well. Denies SOB, Chest or Calf Pain. Using Incentive Spirometer, PAS in place. Ambulate WBAT, Patient reports pain as 6 on 0-10 scale,     Objective: Vital signs in last 24 hours: Temp:  [97.7 F (36.5 C)-100.5 F (38.1 C)] 97.9 F (36.6 C) (08/09 0752) Pulse Rate:  [59-104] 69 (08/09 0752) Resp:  [14-20] 14 (08/09 0752) BP: (130-154)/(77-98) 132/88 (08/09 0752) SpO2:  [91 %-100 %] 98 % (08/09 0752)    Intake/Output from previous day: I/O last 3 completed shifts: In: 4692.7 [P.O.:900; I.V.:1967.6; IV Piggyback:1825.1] Out: 1955 [Urine:1840; Drains:15; Blood:100]   Intake/Output this shift: No intake/output data recorded.   LABORATORY DATA: Recent Labs    02/12/24 1409 02/14/24 0353  WBC 9.1  --   HGB 12.0*  --   HCT 37.5*  --   PLT 533*  --   NA 140 138  K 3.9 4.8  CL 103 101  CO2 25 24  BUN 14 13  CREATININE 0.92 0.77  GLUCOSE 95 199*  CALCIUM 9.2 9.2    Examination: Neurologically intact Neurovascular intact Sensation intact distally Intact pulses distally Dorsiflexion/Plantar flexion intact Incision: moderate drainage} 100 ml from drains Assessment:   1 Day Post-Op Procedure(s) (LRB): ARTHROSCOPY, KNEE (Right)/ I&D of tibial abscess and washout of knee   Plan:  Weight Bearing as Tolerated (WBAT)  DVT Prophylaxis:  Aspirin   DISCHARGE PLAN: Home  DISCHARGE NEEDS: HHRN and IV Antibiotics  Plan to pull drains tomorrow if appropriate.  Pt to get picc line with IV antibiotics per infectious disease.       Garrett Costa 02/14/2024, 8:33 AM

## 2024-02-14 NOTE — Progress Notes (Signed)
 Orthopedic Tech Progress Note Patient Details:  Garrett Costa 26-Feb-1991 [id number]  Patient ID: Garrett Costa, male   DOB: 1990/11/12, 33 y.o.   MRN: [id number] No OHF, Weight restricted. Garrett Costa 02/14/2024, 9:21 AM

## 2024-02-14 NOTE — Progress Notes (Signed)
 Secure chat with RN, aware of plan for PICC placement on Sunday.

## 2024-02-15 LAB — CBC WITH DIFFERENTIAL/PLATELET
Abs Immature Granulocytes: 0.11 K/uL — ABNORMAL HIGH (ref 0.00–0.07)
Basophils Absolute: 0.1 K/uL (ref 0.0–0.1)
Basophils Relative: 1 %
Eosinophils Absolute: 0.1 K/uL (ref 0.0–0.5)
Eosinophils Relative: 1 %
HCT: 37.1 % — ABNORMAL LOW (ref 39.0–52.0)
Hemoglobin: 11.4 g/dL — ABNORMAL LOW (ref 13.0–17.0)
Immature Granulocytes: 1 %
Lymphocytes Relative: 37 %
Lymphs Abs: 3.8 K/uL (ref 0.7–4.0)
MCH: 29.4 pg (ref 26.0–34.0)
MCHC: 30.7 g/dL (ref 30.0–36.0)
MCV: 95.6 fL (ref 80.0–100.0)
Monocytes Absolute: 0.6 K/uL (ref 0.1–1.0)
Monocytes Relative: 5 %
Neutro Abs: 5.7 K/uL (ref 1.7–7.7)
Neutrophils Relative %: 55 %
Platelets: UNDETERMINED K/uL (ref 150–400)
RBC: 3.88 MIL/uL — ABNORMAL LOW (ref 4.22–5.81)
RDW: 12.8 % (ref 11.5–15.5)
WBC: 10.3 K/uL (ref 4.0–10.5)
nRBC: 0 % (ref 0.0–0.2)

## 2024-02-15 NOTE — Plan of Care (Signed)
  Problem: Safety: Goal: Ability to remain free from injury will improve Outcome: Progressing   Problem: Pain Managment: Goal: General experience of comfort will improve and/or be controlled Outcome: Progressing   Problem: Elimination: Goal: Will not experience complications related to urinary retention Outcome: Progressing

## 2024-02-15 NOTE — Anesthesia Postprocedure Evaluation (Signed)
 Anesthesia Post Note  Patient: Haneef A Denson  Procedure(s) Performed: ARTHROSCOPY, KNEE (Right: Knee)     Patient location during evaluation: PACU Anesthesia Type: General Level of consciousness: awake Pain management: pain level controlled Vital Signs Assessment: post-procedure vital signs reviewed and stable Respiratory status: spontaneous breathing, nonlabored ventilation and respiratory function stable Cardiovascular status: blood pressure returned to baseline and stable Postop Assessment: no apparent nausea or vomiting Anesthetic complications: no   No notable events documented.  Last Vitals:  Vitals:   02/14/24 2105 02/15/24 0520  BP: (!) 151/99 (!) 162/95  Pulse: 85 73  Resp: 16 16  Temp: 36.7 C (!) 36.4 C  SpO2: 97% 100%    Last Pain:  Vitals:   02/15/24 0603  TempSrc:   PainSc: Asleep                 Anthony Roland P Vanesa Renier

## 2024-02-15 NOTE — Progress Notes (Addendum)
 Spoke at length re PICC placement.  Pt signed consent for PICC placement but declines placement until able to speak to ID dr regarding the culture results this week. Pt very pleasant with all questions answered.  [facility] LPN aware on unit.

## 2024-02-15 NOTE — Progress Notes (Signed)
 PATIENT ID: Garrett Costa  MRN: [id number]  DOB/AGE:  [birth year] / 33 y.o.  2 Days Post-Op Procedure(s) (LRB): ARTHROSCOPY, KNEE (Right)    PROGRESS NOTE Subjective:   Patient is alert, oriented, no Nausea, no Vomiting, yes passing gas, yes Bowel Movement. Taking PO well. Denies SOB, Chest or Calf Pain. Using Incentive Spirometer, PAS in place. Ambulate WBAT, Patient reports pain as 6 on 0-10 scale, plasma was separated in drains.  Per nurse on 20 ml output in last 12 hrs.   Objective: Vital signs in last 24 hours: Temp:  [97.5 F (36.4 C)-98.5 F (36.9 C)] 97.5 F (36.4 C) (08/10 0520) Pulse Rate:  [73-85] 73 (08/10 0520) Resp:  [16-17] 16 (08/10 0520) BP: (144-162)/(67-99) 162/95 (08/10 0520) SpO2:  [97 %-100 %] 100 % (08/10 0520)    Intake/Output from previous day: I/O last 3 completed shifts: In: 6302.2 [P.O.:720; I.V.:3782.1; IV Piggyback:1800.1] Out: 3755 [Urine:3570; Drains:85; Blood:100]   Intake/Output this shift: No intake/output data recorded.   LABORATORY DATA: Recent Labs    02/12/24 1409 02/14/24 0353  WBC 9.1  --   HGB 12.0*  --   HCT 37.5*  --   PLT 533*  --   NA 140 138  K 3.9 4.8  CL 103 101  CO2 25 24  BUN 14 13  CREATININE 0.92 0.77  GLUCOSE 95 199*  CALCIUM 9.2 9.2    Examination: Neurologically intact Neurovascular intact Sensation intact distally Intact pulses distally Dorsiflexion/Plantar flexion intact Incision: scant drainage} Drains pulled and 4x4s and abd placed over incision/portals.  Assessment:   2 Days Post-Op Procedure(s) (LRB): ARTHROSCOPY, KNEE (Right)I&D of tibial abscess and washout of knee    Plan:  Weight Bearing as Tolerated (WBAT)  DVT Prophylaxis:  Aspirin   DISCHARGE PLAN: Home  DISCHARGE NEEDS: IV Antibiotics  Pt to get picc line with IV antibiotics per infectious disease.     [name] 02/15/2024, 9:26 AM

## 2024-02-15 NOTE — Plan of Care (Signed)
  Problem: Activity: Goal: Risk for activity intolerance will decrease Outcome: Progressing   Problem: Nutrition: Goal: Adequate nutrition will be maintained Outcome: Progressing   Problem: Coping: Goal: Level of anxiety will decrease Outcome: Progressing   Problem: Elimination: Goal: Will not experience complications related to bowel motility Outcome: Progressing   Problem: Pain Managment: Goal: General experience of comfort will improve and/or be controlled Outcome: Progressing

## 2024-02-15 NOTE — Progress Notes (Signed)
 Dr Loney Stank ID approves PICC placement.

## 2024-02-16 ENCOUNTER — Other Ambulatory Visit: Payer: Self-pay

## 2024-02-16 ENCOUNTER — Encounter (HOSPITAL_COMMUNITY): Payer: Self-pay | Admitting: Orthopedic Surgery

## 2024-02-16 DIAGNOSIS — M00061 Staphylococcal arthritis, right knee: Secondary | ICD-10-CM

## 2024-02-16 LAB — FUNGUS STAIN

## 2024-02-16 LAB — CK: Total CK: 56 U/L (ref 49–397)

## 2024-02-16 LAB — ACID FAST SMEAR (AFB, MYCOBACTERIA)
Acid Fast Smear: NEGATIVE
Acid Fast Smear: NEGATIVE

## 2024-02-16 LAB — FUNGAL STAIN REFLEX

## 2024-02-16 MED ORDER — DAPTOMYCIN IV (FOR PTA / DISCHARGE USE ONLY): 900.0000 mg | INTRAVENOUS | 0 refills | Status: DC

## 2024-02-16 MED ORDER — OXYCODONE-ACETAMINOPHEN 5-325 MG PO TABS: 1.0000 | ORAL_TABLET | ORAL | 0 refills | Status: AC | PRN

## 2024-02-16 MED ORDER — CHLORHEXIDINE GLUCONATE CLOTH 2 % EX PADS
6.0000 | MEDICATED_PAD | Freq: Every day | CUTANEOUS | Status: DC
  Administered 2024-02-16 – 2024-02-18 (×6): 6 via TOPICAL

## 2024-02-16 MED ORDER — SODIUM CHLORIDE 0.9 % IV SOLN
8.0000 mg/kg | Freq: Every day | INTRAVENOUS | Status: DC
  Administered 2024-02-16 – 2024-02-17 (×4): 900 mg via INTRAVENOUS
  Filled 2024-02-16 (×3): qty 18

## 2024-02-16 MED ORDER — SODIUM CHLORIDE 0.9% FLUSH: 10.0000 mL | INTRAVENOUS | Status: DC | PRN

## 2024-02-16 MED ORDER — CEFEPIME IV (FOR PTA / DISCHARGE USE ONLY): 2.0000 g | Freq: Three times a day (TID) | INTRAVENOUS | 0 refills | Status: DC

## 2024-02-16 MED ORDER — ASPIRIN 81 MG PO TBEC: 81.0000 mg | DELAYED_RELEASE_TABLET | Freq: Two times a day (BID) | ORAL | 0 refills | Status: AC

## 2024-02-16 NOTE — Progress Notes (Signed)
 [facility] Infectious Diseases Follow Up Note  Patient Identification: Patient Name: Garrett Costa MRN: [id number] Admit Date: 02/12/2024  1:31 PM Age: 33 y.o.Today's Date: 02/16/2024  Reason for Visit: Right knee septic arthritis  Principal Problem:   Septic arthritis of knee, right (HCC) Active Problems:   S/P reconstruction of ACL of right knee using hamstring autograft   Retained orthopedic hardware   Antibiotics: Vancomycin  8/7- Cefepime  8/7-  Lines/Hardwares:   Interval Events: Remains afebrile, no labs today   Assessment 33 year old male with prior history of rt knee ACL tear repair with hamstring allograft admitted with  # Right knee septic arthritis -On p.o. antibiotics since 01/22/2024, initially with cefadroxil for 15 days followed by levofloxacin and trimethoprim/sulfamethoxazole - 7/28 rt knee aspirate and cx ( media), no organism in gram stain and negative cx; WBC 44K  - 8/7 appears to haved rt knee aspiration done at Ortho office with concerns for viral arthrosis ( no results available to review) - 8/8 arthroscopic I&D with 3 compartment synovectomy; I&D of proximal right tibial abscess that went down to the Endobutton on the tibial tunnel. Per Or note, ACL graft was intact and 1+ laxity probing; grayish purulent material+, button was left in place. OR cx staph lugdunensis, staph epidermidis, negative AFB stain and fungal stain.  - Per Ortho PA today  No growth from our office obtained 8/7. Allograft not removed. It is a hamstring allograft with endobutton made from stainless or titanium.   Recommendations - Patient is cleared for discharge from orthopedics.  Will place PICC line.  Change antibiotics to daptomycin  and cefepime   pending maturation of cultures.  Plan for 6 weeks of IV antibiotics from OR date with possible transition to p.o. due to retained allograft.  - Will follow or cultures to completion, otherwise  ID will sign off.   OPAT  Diagnosis: RT knee septic arthritis with concerns for osteomyelitis   Culture Result: staph lugdunensis, staph epidermidis, not finalized yet,   Allergies  Allergen Reactions   Topiramate Nausea Only    OPAT Orders Discharge antibiotics to be given via PICC line Discharge antibiotics:Daptomycin  900 mg IV every 24 hours + Cefepime  2 gm IV Q 8 hours ( was on levofloxacin OP> cefepime  IP prior to OR) Per pharmacy protocol  Duration: 6 weeks End Date: 03/26/24  [facility] Care Per Protocol:  Home health RN for IV administration and teaching; PICC line care and labs.    Labs weekly while on IV antibiotics: X__ CBC with differential __ BMP X__ CMP X__ CRP X__ ESR __ Vancomycin  trough X__ CK  __ Please pull PIC at completion of IV antibiotics X__ Please leave PIC in place until doctor has seen patient or been notified  Fax weekly labs to [phone]  Clinic Follow Up Appt: 9/2 at 3: 30 pm   Rest of the management as per the primary team. Thank you for the consult. Please page with pertinent questions or concerns.  ______________________________________________________________________ Subjective patient seen and examined at the bedside.  No concerns, asking about PICC line placement, eager to go home.   Past Medical History:  Diagnosis Date   Migraines    Past Surgical History:  Procedure Laterality Date   KNEE ARTHROSCOPY Right 02/13/2024   Procedure: ARTHROSCOPY, KNEE;  Surgeon: Liam Lerner, MD;  Location: [facility];  Service: Orthopedics;  Laterality: Right;  Arthroscopy, Irrigation and Debridement of Right knee, Possible Removal Of Infected Graft   KNEE SURGERY      Vitals BP  114/68 (BP Location: Left Arm)   Pulse 61   Temp 98 F (36.7 C) (Oral)   Resp 17   Ht 6' 2 (1.88 m)   Wt (!) 142.9 kg   SpO2 96%   BMI 40.44 kg/m     Physical Exam Constitutional: Adult male lying in the bed, NAD    Comments: HEENT WNL  Cardiovascular:      Rate and Rhythm: Normal rate and regular rhythm.     Heart sounds: S1 and S2  Pulmonary:     Effort: Pulmonary effort is normal.     Comments: Normal breath sounds  Abdominal:     Palpations: Abdomen is soft.     Tenderness: Nondistended and nontender  Musculoskeletal:        General: Right knee and leg covered with a dressing C/D/I  Skin:    Comments: No rashes  Neurological:     General: Awake, alert and oriented, grossly nonfocal  Psychiatric:        Mood and Affect: Mood normal.   Pertinent Microbiology Results for orders placed or performed during the hospital encounter of 02/12/24  Culture, blood (Routine X 2) w Reflex to ID Panel     Status: None (Preliminary result)   Collection Time: 02/12/24  5:24 PM   Specimen: BLOOD RIGHT ARM  Result Value Ref Range Status   Specimen Description   Final    BLOOD RIGHT ARM Performed at [facility], [address] [place], [place] [address]    Special Requests   Final    BOTTLES DRAWN AEROBIC AND ANAEROBIC Blood Culture adequate volume Performed at [facility] [place], [address] [place], [place] [address]    Culture   Final    NO GROWTH 4 DAYS Performed at [facility], [address], [place], [place] [address]    Report Status PENDING  Incomplete  Culture, blood (Routine X 2) w Reflex to ID Panel     Status: None (Preliminary result)   Collection Time: 02/12/24  5:33 PM   Specimen: BLOOD LEFT ARM  Result Value Ref Range Status   Specimen Description BLOOD LEFT ARM  Final   Special Requests   Final    BOTTLES DRAWN AEROBIC ONLY Blood Culture adequate volume   Culture   Final    NO GROWTH 4 DAYS Performed at [facility], [address] [place], [place] [address]    Report Status PENDING  Incomplete  Aerobic/Anaerobic Culture w Gram Stain (surgical/deep wound)     Status: None (Preliminary result)   Collection Time: 02/13/24  6:30 PM   Specimen: Path fluid; Body Fluid  Result  Value Ref Range Status   Specimen Description   Final    FLUID Performed at [facility], [address] [place], [place] [address]    Special Requests   Final    FLUID Performed at [facility], [address] [place], [place] [address]    Gram Stain   Final    FEW WBC PRESENT, PREDOMINANTLY PMN NO ORGANISMS SEEN    Culture   Final    NO GROWTH 1 DAY Performed at [facility], [address] [place], [place] [address]    Report Status PENDING  Incomplete  Acid Fast Smear (AFB)     Status: None   Collection Time: 02/13/24  6:30 PM   Specimen: Path fluid; Body Fluid  Result Value Ref Range Status  AFB Specimen Processing Concentration  Final   Acid Fast Smear Negative  Final    Comment: (NOTE) Performed At: [facility] [address] [place], [place] [id number] Jennette Shorter MD Ey:[id number]    Source (AFB) FLUID  Final    Comment: Performed at [facility], [address] [place], [place] [address]  Aerobic/Anaerobic Culture w Gram Stain (surgical/deep wound)     Status: None (Preliminary result)   Collection Time: 02/13/24  6:58 PM   Specimen: Path fluid; Body Fluid  Result Value Ref Range Status   Specimen Description   Final    WOUND Performed at [facility], [address] [place], [place] [address]    Special Requests   Final    WOUND Performed at [facility], [address], [place], [place] [address]    Gram Stain   Final    RARE WBC PRESENT, PREDOMINANTLY PMN NO ORGANISMS SEEN    Culture   Final    CULTURE REINCUBATED FOR BETTER GROWTH Performed at [facility], [address], [place], [place] [address]    Report Status PENDING  Incomplete  Acid Fast Smear (AFB)     Status: None   Collection Time: 02/13/24  6:58 PM   Specimen: Path fluid; Body Fluid  Result Value Ref Range Status   AFB Specimen Processing Concentration  Final    Acid Fast Smear Negative  Final    Comment: (NOTE) Performed At: [facility] [address] [place], [place] [id number] Jennette Shorter MD Ey:[id number]    Source (AFB) WOUND  Final    Comment: Performed at [facility], [address], [place], [place] [address]   Pertinent Lab.    Latest Ref Rng & Units 02/15/2024    9:52 AM 02/12/2024    2:09 PM  CBC  WBC 4.0 - 10.5 K/uL 10.3  9.1   Hemoglobin 13.0 - 17.0 g/dL 88.5  87.9   Hematocrit 39.0 - 52.0 % 37.1  37.5   Platelets 150 - 400 K/uL PLATELET CLUMPS NOTED ON SMEAR, UNABLE TO ESTIMATE  533       Latest Ref Rng & Units 02/14/2024    3:53 AM 02/12/2024    2:09 PM  CMP  Glucose 70 - 99 mg/dL 800  95   BUN 6 - 20 mg/dL 13  14   Creatinine 9.38 - 1.24 mg/dL 9.22  9.07   Sodium 864 - 145 mmol/L 138  140   Potassium 3.5 - 5.1 mmol/L 4.8  3.9   Chloride 98 - 111 mmol/L 101  103   CO2 22 - 32 mmol/L 24  25   Calcium 8.9 - 10.3 mg/dL 9.2  9.2   Total Protein 6.5 - 8.1 g/dL  8.1   Total Bilirubin 0.0 - 1.2 mg/dL  0.7   Alkaline Phos 38 - 126 U/L  124   AST 15 - 41 U/L  36   ALT 0 - 44 U/L  56      Pertinent Imaging today Plain films and CT images have been personally visualized and interpreted; radiology reports have been reviewed. Decision making incorporated into the Impression /   US  EKG SITE RITE Result Date: 02/13/2024 If Site Rite image not attached, placement could not be confirmed due to current cardiac rhythm.  I spent 51 minutes involved in face-to-face and non-face-to-face activities for this patient on the day of the visit. Professional time spent includes the following  activities: Preparing to see the patient (review of tests), Obtaining and reviewing separately obtained history (notes from orthopedics, Dr. Searcy, OR note), Performing a medically appropriate examination and evaluation , Ordering medications/labs, referring and communicating with other health care professionals including  orthopedics, Documenting clinical information in the EMR, Independently interpreting results (not separately reported), Communicating results to the patient, Counseling and educating the patient and Care coordination (not separately reported).   Plan d/w requesting provider as well as ID pharm D  Of note, portions of this note may have been created with voice recognition software. While this note has been edited for accuracy, occasional wrong-word or 'sound-a-like' substitutions may have occurred due to the inherent limitations of voice recognition software.   Electronically signed by:   Annalee Orem, MD Infectious Disease Physician [facility] Pager: [phone]

## 2024-02-16 NOTE — Progress Notes (Signed)
 PHARMACY CONSULT NOTE FOR:  OUTPATIENT  PARENTERAL ANTIBIOTIC THERAPY (OPAT)  Indication: septic arthritis of right knee Regimen: Daptomycin  900 mg IV every 24 hours + Cefepime  2 gm IV Q 8 hours  End date: 03/26/24   IV antibiotic discharge orders are pended. To discharging provider:  please sign these orders via discharge navigator,  Select New Orders & click on the button choice - Manage This Unsigned Work.     Thank you for allowing pharmacy to be a part of this patient's care.  Damien Quiet, PharmD, BCPS, BCIDP Infectious Diseases Clinical Pharmacist Phone: [phone] 02/16/2024, 9:53 AM

## 2024-02-16 NOTE — Progress Notes (Signed)
 Subjective: 3 Days Post-Op Procedure(s) (LRB): ARTHROSCOPY, KNEE (Right) Patient reports pain as 5/10  Objective: Vital signs in last 24 hours: Temp:  [98 F (36.7 C)-98.2 F (36.8 C)] 98 F (36.7 C) (08/11 0528) Pulse Rate:  [61-81] 61 (08/11 0528) Resp:  [17-18] 17 (08/11 0528) BP: (114-141)/(68-85) 114/68 (08/11 0528) SpO2:  [96 %-99 %] 96 % (08/11 0528)  Intake/Output from previous day: 08/10 0701 - 08/11 0700 In: 1180 [P.O.:480; IV Piggyback:700] Out: 1600 [Urine:1600] Intake/Output this shift: Total I/O In: -  Out: 675 [Urine:675]  Recent Labs    02/15/24 0952  HGB 11.4*   Recent Labs    02/15/24 0952  WBC 10.3  RBC 3.88*  HCT 37.1*  PLT PLATELET CLUMPS NOTED ON SMEAR, UNABLE TO ESTIMATE   Recent Labs    02/14/24 0353  NA 138  K 4.8  CL 101  CO2 24  BUN 13  CREATININE 0.77  GLUCOSE 199*  CALCIUM 9.2   The patient's abscess culture is on reincubation.  The synovial fluid has not grown anything.  Neurologically intact ABD soft Neurovascular intact Sensation intact distally Intact pulses distally Dorsiflexion/Plantar flexion intact Incision: dressing C/D/I and the knee has a trace effusion, the cellulitis is settled down.  No drainage from the portals or the wound.  All drains were pulled yesterday. No cellulitis present Compartment soft   Assessment/Plan: 3 Days Post-Op Procedure(s) (LRB): ARTHROSCOPY, KNEE (Right) We will await infectious disease input for final antibiotics and placement of a single or double lumen PICC line.  Once that has been accomplished the patient should be ready for discharge.   Dempsey JINNY Sensor 02/16/2024, 9:00 AM

## 2024-02-16 NOTE — Discharge Summary (Addendum)
 Patient ID: Garrett Costa MRN: [id number] DOB/AGE: Sep 05, 1990 32 y.o.  Admit date: 02/12/2024 Discharge date: 02/16/2024  Admission Diagnoses:  Principal Problem:   Septic arthritis of knee, right (HCC) Active Problems:   S/P reconstruction of ACL of right knee using hamstring autograft   Retained orthopedic hardware   Discharge Diagnoses:  Same  Past Medical History:  Diagnosis Date   Migraines     Surgeries: Procedure(s): ARTHROSCOPY, KNEE on 02/13/2024   Consultants:   Discharged Condition: Improved  Hospital Course: Garrett Costa is an 33 y.o. male who was admitted 02/12/2024 for operative treatment ofSeptic arthritis of knee, right (HCC). Patient has severe unremitting pain that affects sleep, daily activities, and work/hobbies. After pre-op clearance the patient was taken to the operating room on 02/13/2024 and underwent  Procedure(s): ARTHROSCOPY, KNEE.    Patient was given perioperative antibiotics:  Anti-infectives (From admission, onward)    Start     Dose/Rate Route Frequency Ordered Stop   02/16/24 1400  DAPTOmycin  (CUBICIN ) 900 mg in sodium chloride  0.9 % IVPB        8 mg/kg  106.5 kg (Adjusted) 136 mL/hr over 30 Minutes Intravenous Daily 02/16/24 0953     02/16/24 0000  daptomycin  (CUBICIN ) IVPB        900 mg Intravenous Every 24 hours 02/16/24 0956 03/26/24 2359   02/16/24 0000  ceFEPime  (MAXIPIME ) IVPB        2 g Intravenous Every 8 hours 02/16/24 0956 03/26/24 2359   02/14/24 0600  ceFAZolin  (ANCEF ) IVPB 2g/100 mL premix  Status:  Discontinued        2 g 200 mL/hr over 30 Minutes Intravenous On call to O.R. 02/13/24 2027 02/13/24 2038   02/13/24 1400  vancomycin  (VANCOREADY) IVPB 2000 mg/400 mL  Status:  Discontinued        2,000 mg 200 mL/hr over 120 Minutes Intravenous Every 12 hours 02/13/24 0851 02/16/24 0953   02/13/24 0200  vancomycin  (VANCOREADY) IVPB 1500 mg/300 mL  Status:  Discontinued        1,500 mg 150 mL/hr over 120 Minutes Intravenous Every 8  hours 02/12/24 1444 02/13/24 0851   02/12/24 1530  vancomycin  (VANCOCIN ) 2,500 mg in sodium chloride  0.9 % 500 mL IVPB        2,500 mg 262.5 mL/hr over 120 Minutes Intravenous  Once 02/12/24 1444 02/12/24 2250   02/12/24 1500  vancomycin  (VANCOREADY) IVPB 2000 mg/400 mL  Status:  Discontinued        2,000 mg 200 mL/hr over 120 Minutes Intravenous Every 12 hours 02/12/24 1408 02/12/24 1444   02/12/24 1500  ceFEPIme  (MAXIPIME ) 2 g in sodium chloride  0.9 % 100 mL IVPB        2 g 200 mL/hr over 30 Minutes Intravenous Every 8 hours 02/12/24 1414          Patient was given sequential compression devices, early ambulation, and chemoprophylaxis to prevent DVT.  Inpatient Morphine Milligram Equivalents Per Day 8/7 - 8/11   Values displayed are in units of MME/Day    Order Start / End Date 8/7 8/8 8/9 Yesterday Today    oxyCODONE  (Oxy IR/ROXICODONE ) immediate release tablet 5 mg 8/8 - 8/8 -- 0 of Unknown -- -- --    oxyCODONE  (ROXICODONE ) 5 MG/5ML solution 5 mg 8/8 - 8/8 -- 0 of Unknown -- -- --       Group total: 0 of Unknown       oxyCODONE  (Oxy IR/ROXICODONE ) immediate release tablet 5-10 mg  8/7 - 8/8 15 of 22.5-45 30 of 37.5-75 -- -- --    fentaNYL  (SUBLIMAZE ) injection 25-50 mcg 8/8 - 8/8 -- 0 of 45-90 -- -- --    fentaNYL  (SUBLIMAZE ) injection 8/8 - 8/8 -- *60 of 60 -- -- --    HYDROmorphone  (DILAUDID ) injection 8/8 - 8/8 -- *40 of 40 -- -- --    HYDROmorphone  (DILAUDID ) injection 0.5-1 mg 8/8 - No end date -- 20 of 10-20 80 of 60-120 40 of 60-120 40 of 60-120    oxyCODONE  (Oxy IR/ROXICODONE ) immediate release tablet 5-10 mg 8/8 - No end date -- 15 of 7.5-15 30 of 45-90 45 of 45-90 15 of 45-90    traMADol  (ULTRAM ) tablet 50 mg 8/9 - No end date -- 5 of 0 20 of 20 15 of 20 10 of 20    Daily Totals  15 of 22.5-45 * 170 of Unknown (at least 200-300) 130 of 125-230 100 of 125-230 65 of 125-230  *One-Step medication  Calculation Errors     Order Type Date Details   oxyCODONE  (Oxy  IR/ROXICODONE ) immediate release tablet 5 mg Ordered Dose -- Insufficient frequency information   oxyCODONE  (ROXICODONE ) 5 MG/5ML solution 5 mg Ordered Dose -- Insufficient frequency information            Patient benefited maximally from hospital stay and there were no complications.    Recent vital signs: Patient Vitals for the past 24 hrs:  BP Temp Temp src Pulse Resp SpO2  02/16/24 0528 114/68 98 F (36.7 C) Oral 61 17 96 %  02/15/24 2120 129/75 98.2 F (36.8 C) Oral 75 17 99 %  02/15/24 1316 (!) 141/85 98.1 F (36.7 C) Oral 81 18 98 %     Recent laboratory studies:  Recent Labs    02/14/24 0353 02/15/24 0952  WBC  --  10.3  HGB  --  11.4*  HCT  --  37.1*  PLT  --  PLATELET CLUMPS NOTED ON SMEAR, UNABLE TO ESTIMATE  NA 138  --   K 4.8  --   CL 101  --   CO2 24  --   BUN 13  --   CREATININE 0.77  --   GLUCOSE 199*  --   CALCIUM 9.2  --      Discharge Medications:   Allergies as of 02/16/2024       Reactions   Topiramate Nausea Only        Medication List     STOP taking these medications    cefadroxil 500 MG capsule Commonly known as: DURICEF   ibuprofen  600 MG tablet Commonly known as: ADVIL    levofloxacin 500 MG tablet Commonly known as: LEVAQUIN   meloxicam 15 MG tablet Commonly known as: MOBIC   predniSONE 20 MG tablet Commonly known as: DELTASONE   sulfamethoxazole-trimethoprim 800-160 MG tablet Commonly known as: BACTRIM DS   traMADol  50 MG tablet Commonly known as: ULTRAM        TAKE these medications    aspirin  EC 81 MG tablet Take 1 tablet (81 mg total) by mouth 2 (two) times daily.   ceFEPime  IVPB Commonly known as: MAXIPIME  Inject 2 g into the vein every 8 (eight) hours. Indication:  Septic arthritis of right knee First Dose: Yes Last Day of Therapy:  03/26/24 Labs - Once weekly:  CBC/D and BMP, Labs - Once weekly: ESR and CRP Method of administration: IV Push Method of administration may be changed at the  discretion of  home infusion pharmacist based upon assessment of the patient and/or caregiver's ability to self-administer the medication ordered.   daptomycin  IVPB Commonly known as: CUBICIN  Inject 900 mg into the vein daily. Indication:  Septic arthritis of right knee First Dose: Yes Last Day of Therapy:  03/26/24 Labs - Once weekly:  CBC/D, BMP, and CPK Labs - Once weekly: ESR and CRP Method of administration: IV Push Method of administration may be changed at the discretion of home infusion pharmacist based upon assessment of the patient and/or caregiver's ability to self-administer the medication ordered.   hydrOXYzine  25 MG tablet Commonly known as: ATARAX  Take 25 mg by mouth 3 (three) times daily.   metaxalone 800 MG tablet Commonly known as: SKELAXIN Take 800 mg by mouth 3 (three) times daily as needed for muscle spasms.   oxyCODONE -acetaminophen  5-325 MG tablet Commonly known as: PERCOCET/ROXICET Take 1 tablet by mouth every 4 (four) hours as needed for severe pain (pain score 7-10). What changed:  when to take this reasons to take this               Discharge Care Instructions  (From admission, onward)           Start     Ordered   02/16/24 0000  Change dressing on IV access line weekly and PRN  (Home infusion instructions - [facility] )        02/16/24 0956   02/16/24 0000  Weight bearing as tolerated        02/16/24 1225            Diagnostic Studies: US  EKG SITE RITE Result Date: 02/16/2024 If Site Rite image not attached, placement could not be confirmed due to current cardiac rhythm.  US  EKG SITE RITE Result Date: 02/13/2024 If Site Rite image not attached, placement could not be confirmed due to current cardiac rhythm.   Disposition: Discharge disposition: 01-Home or Self Care       Discharge Instructions     [facility] pharmacist to adjust dose for Vancomycin , Aminoglycosides and other anti-infective therapies  as requested by physician.   Complete by: As directed    [facility] to provide Cath Flo 2mg    Complete by: As directed    Administer for PICC line occlusion and as ordered by physician for other access device issues.   Anaphylaxis Kit: Provided to treat any anaphylactic reaction to the medication being provided to the patient if First Dose or when requested by physician   Complete by: As directed    Epinephrine  1mg /ml vial / amp: Administer 0.3mg  (0.51ml) subcutaneously once for moderate to severe anaphylaxis, nurse to call physician and pharmacy when reaction occurs and call 911 if needed for immediate care   Diphenhydramine 50mg /ml IV vial: Administer 25-50mg  IV/IM PRN for first dose reaction, rash, itching, mild reaction, nurse to call physician and pharmacy when reaction occurs   Sodium Chloride  0.9% NS 500ml IV: Administer if needed for hypovolemic blood pressure drop or as ordered by physician after call to physician with anaphylactic reaction   Call MD / Call 911   Complete by: As directed    If you experience chest pain or shortness of breath, CALL 911 and be transported to the hospital emergency room.  If you develope a fever above 101 F, pus (white drainage) or increased drainage or redness at the wound, or calf pain, call your surgeon's office.   Change dressing on IV access line weekly and PRN  to treat any anaphylactic reaction to the medication being provided to the patient if First Dose or when requested by physician   Complete by: As directed    Epinephrine  1mg /ml vial / amp: Administer 0.3mg  (0.26ml) subcutaneously once for moderate to severe anaphylaxis, nurse to call physician and pharmacy when reaction occurs and call 911 if needed for immediate care   Diphenhydramine 50mg /ml IV vial: Administer 25-50mg  IV/IM PRN for first dose reaction, rash, itching, mild reaction, nurse to call physician and pharmacy when reaction occurs   Sodium Chloride  0.9% NS 500ml IV: Administer if needed for hypovolemic blood pressure drop or as ordered by physician after call to physician with anaphylactic reaction   Anaphylaxis Kit: Provided to treat any anaphylactic reaction to the medication being provided to the patient if First Dose or when requested by physician    Complete by: As directed    Epinephrine  1mg /ml vial / amp: Administer 0.3mg  (0.24ml) subcutaneously once for moderate to severe anaphylaxis, nurse to call physician and pharmacy when reaction occurs and call 911 if needed for immediate care   Diphenhydramine 50mg /ml IV vial: Administer 25-50mg  IV/IM PRN for first dose reaction, rash, itching, mild reaction, nurse to call physician and pharmacy when reaction occurs   Sodium Chloride  0.9% NS 500ml IV: Administer if needed for hypovolemic blood pressure drop or as ordered by physician after call to physician with anaphylactic reaction   Call MD / Call 911   Complete by: As directed    If you experience chest pain or shortness of breath, CALL 911 and be transported to the hospital emergency room.  If you develope a fever above 101 F, pus (white drainage) or increased drainage or redness at the wound, or calf pain, call your surgeon's office.   Call MD / Call 911   Complete by: As directed    If you experience chest pain or shortness of breath, CALL 911 and be transported to the hospital emergency room.  If you develope a fever above 101 F, pus (white drainage) or increased drainage or redness at the wound, or calf pain, call your surgeon's office.   Call MD / Call 911   Complete by: As directed    If you experience chest pain or shortness of breath, CALL 911 and be transported to the hospital emergency room.  If you develope a fever above 101 F, pus (white drainage) or increased drainage or redness at the wound, or calf pain, call your surgeon's office.   Change dressing on IV access line weekly and PRN   Complete by: As directed    Change dressing on IV access line weekly and PRN   Complete by: As directed    Constipation Prevention   Complete by: As directed    Drink plenty of fluids.  Prune juice may be helpful.  You may use a stool softener, such as Colace (over the counter) 100 mg twice a day.  Use MiraLax (over the counter) for constipation as  needed.   Constipation Prevention   Complete by: As directed    Drink plenty of fluids.  Prune juice may be helpful.  You may use a stool softener, such as Colace (over the counter) 100 mg twice a day.  Use MiraLax (over the counter) for constipation as needed.   Constipation Prevention   Complete by: As directed    Drink plenty of fluids.  Prune juice may be helpful.  You may use a stool softener, such as Colace (over the  counter) 100 mg twice a day.  Use MiraLax (over the counter) for constipation as needed.   Diet - low sodium heart healthy   Complete by: As directed    Driving restrictions   Complete by: As directed    No driving for 2 weeks   Driving restrictions   Complete by: As directed    No driving for 2 weeks   Driving restrictions   Complete by: As directed    No driving for 2 weeks   Flush IV access with Sodium Chloride  0.9% and Heparin  10 units/ml or 100 units/ml   Complete by: As directed    Flush IV access with Sodium Chloride  0.9% and Heparin  10 units/ml or 100 units/ml   Complete by: As directed    Home infusion instructions - [facility]   Complete by: As directed    Instructions: Flush IV access with Sodium Chloride  0.9% and Heparin  10units/ml or 100units/ml   Change dressing on IV access line: Weekly and PRN   Instructions Cath Flo 2mg : Administer for PICC Line occlusion and as ordered by physician for other access device   [facility] pharmacist to adjust dose for: Vancomycin , Aminoglycosides and other anti-infective therapies as requested by physician   Home infusion instructions - [facility]   Complete by: As directed    Instructions: Flush IV access with Sodium Chloride  0.9% and Heparin  10units/ml or 100units/ml   Change dressing on IV access line: Weekly and PRN   Instructions Cath Flo 2mg : Administer for PICC Line occlusion and as ordered by physician for other access device   [facility] pharmacist to adjust  dose for: Vancomycin , Aminoglycosides and other anti-infective therapies as requested by physician   Increase activity slowly as tolerated   Complete by: As directed    Increase activity slowly as tolerated   Complete by: As directed    Increase activity slowly as tolerated   Complete by: As directed    Method of administration may be changed at the discretion of home infusion pharmacist based upon assessment of the patient and/or caregiver's ability to self-administer the medication ordered   Complete by: As directed    Method of administration may be changed at the discretion of home infusion pharmacist based upon assessment of the patient and/or caregiver's ability to self-administer the medication ordered   Complete by: As directed    Patient may shower   Complete by: As directed    You may shower without a dressing once there is no drainage.  Do not wash over the wound.  If drainage remains, cover wound with plastic wrap and then shower.   Patient may shower   Complete by: As directed    You may shower without a dressing once there is no drainage.  Do not wash over the wound.  If drainage remains, cover wound with plastic wrap and then shower.   Patient may shower   Complete by: As directed    You may shower without a dressing once there is no drainage.  Do not wash over the wound.  If drainage remains, cover wound with plastic wrap and then shower.   Post-operative opioid taper instructions:   Complete by: As directed    POST-OPERATIVE OPIOID TAPER INSTRUCTIONS: It is important to wean off of your opioid medication as soon as possible. If you do not need pain medication after your surgery it is ok to stop day one. Opioids include: Codeine, Hydrocodone(Norco, Vicodin), Oxycodone (Percocet, oxycontin ) and  hydromorphone  amongst others.  Long term and even short term use of opiods can cause: Increased pain response Dependence Constipation Depression Respiratory depression And more.   Withdrawal symptoms can include Flu like symptoms Nausea, vomiting And more Techniques to manage these symptoms Hydrate well Eat regular healthy meals Stay active Use relaxation techniques(deep breathing, meditating, yoga) Do Not substitute Alcohol to help with tapering If you have been on opioids for less than two weeks and do not have pain than it is ok to stop all together.  Plan to wean off of opioids This plan should start within one week post op of your joint replacement. Maintain the same interval or time between taking each dose and first decrease the dose.  Cut the total daily intake of opioids by one tablet each day Next start to increase the time between doses. The last dose that should be eliminated is the evening dose.      Post-operative opioid taper instructions:   Complete by: As directed    POST-OPERATIVE OPIOID TAPER INSTRUCTIONS: It is important to wean off of your opioid medication as soon as possible. If you do not need pain medication after your surgery it is ok to stop day one. Opioids include: Codeine, Hydrocodone(Norco, Vicodin), Oxycodone (Percocet, oxycontin ) and hydromorphone  amongst others.  Long term and even short term use of opiods can cause: Increased pain response Dependence Constipation Depression Respiratory depression And more.  Withdrawal symptoms can include Flu like symptoms Nausea, vomiting And more Techniques to manage these symptoms Hydrate well Eat regular healthy meals Stay active Use relaxation techniques(deep breathing, meditating, yoga) Do Not substitute Alcohol to help with tapering If you have been on opioids for less than two weeks and do not have pain than it is ok to stop all together.  Plan to wean off of opioids This plan should start within one week post op of your joint replacement. Maintain the same interval or time between taking each dose and first decrease the dose.  Cut the total daily intake of opioids by  one tablet each day Next start to increase the time between doses. The last dose that should be eliminated is the evening dose.      Post-operative opioid taper instructions:   Complete by: As directed    POST-OPERATIVE OPIOID TAPER INSTRUCTIONS: It is important to wean off of your opioid medication as soon as possible. If you do not need pain medication after your surgery it is ok to stop day one. Opioids include: Codeine, Hydrocodone(Norco, Vicodin), Oxycodone (Percocet, oxycontin ) and hydromorphone  amongst others.  Long term and even short term use of opiods can cause: Increased pain response Dependence Constipation Depression Respiratory depression And more.  Withdrawal symptoms can include Flu like symptoms Nausea, vomiting And more Techniques to manage these symptoms Hydrate well Eat regular healthy meals Stay active Use relaxation techniques(deep breathing, meditating, yoga) Do Not substitute Alcohol to help with tapering If you have been on opioids for less than two weeks and do not have pain than it is ok to stop all together.  Plan to wean off of opioids This plan should start within one week post op of your joint replacement. Maintain the same interval or time between taking each dose and first decrease the dose.  Cut the total daily intake of opioids by one tablet each day Next start to increase the time between doses. The last dose that should be eliminated is the evening dose.      Weight bearing as tolerated  Complete by: As directed    Weight bearing as tolerated   Complete by: As directed    Weight bearing as tolerated   Complete by: As directed         Follow-up Information     Yvone Rush, MD Follow up in 3 day(s).   Specialty: Orthopedic Surgery Contact information: [address] [place] [address] [phone]                  Signed: [name] 2024/03/09, 8:29 AM

## 2024-02-16 NOTE — Discharge Summary (Signed)
 Patient ID: Garrett Costa MRN: [id number] DOB/AGE: Sep 05, 1990 33 y.o.  Admit date: 02/12/2024 Discharge date: 02/16/2024  Admission Diagnoses:  Principal Problem:   Septic arthritis of knee, right (HCC) Active Problems:   S/P reconstruction of ACL of right knee using hamstring autograft   Retained orthopedic hardware   Discharge Diagnoses:  Same  Past Medical History:  Diagnosis Date   Migraines     Surgeries: Procedure(s): ARTHROSCOPY, KNEE on 02/13/2024   Consultants:   Discharged Condition: Improved  Hospital Course: [name] is an 33 y.o. male who was admitted 02/12/2024 for operative treatment ofSeptic arthritis of knee, right (HCC). Patient has severe unremitting pain that affects sleep, daily activities, and work/hobbies. After pre-op clearance the patient was taken to the operating room on 02/13/2024 and underwent  Procedure(s): ARTHROSCOPY, KNEE.    Patient was given perioperative antibiotics:  Anti-infectives (From admission, onward)    Start     Dose/Rate Route Frequency Ordered Stop   02/16/24 1400  DAPTOmycin  (CUBICIN ) 900 mg in sodium chloride  0.9 % IVPB        8 mg/kg  106.5 kg (Adjusted) 136 mL/hr over 30 Minutes Intravenous Daily 02/16/24 0953     02/16/24 0000  daptomycin  (CUBICIN ) IVPB        900 mg Intravenous Every 24 hours 02/16/24 0956 03/26/24 2359   02/16/24 0000  ceFEPime  (MAXIPIME ) IVPB        2 g Intravenous Every 8 hours 02/16/24 0956 03/26/24 2359   02/14/24 0600  ceFAZolin  (ANCEF ) IVPB 2g/100 mL premix  Status:  Discontinued        2 g 200 mL/hr over 30 Minutes Intravenous On call to O.R. 02/13/24 2027 02/13/24 2038   02/13/24 1400  vancomycin  (VANCOREADY) IVPB 2000 mg/400 mL  Status:  Discontinued        2,000 mg 200 mL/hr over 120 Minutes Intravenous Every 12 hours 02/13/24 0851 02/16/24 0953   02/13/24 0200  vancomycin  (VANCOREADY) IVPB 1500 mg/300 mL  Status:  Discontinued        1,500 mg 150 mL/hr over 120 Minutes Intravenous Every 8  hours 02/12/24 1444 02/13/24 0851   02/12/24 1530  vancomycin  (VANCOCIN ) 2,500 mg in sodium chloride  0.9 % 500 mL IVPB        2,500 mg 262.5 mL/hr over 120 Minutes Intravenous  Once 02/12/24 1444 02/12/24 2250   02/12/24 1500  vancomycin  (VANCOREADY) IVPB 2000 mg/400 mL  Status:  Discontinued        2,000 mg 200 mL/hr over 120 Minutes Intravenous Every 12 hours 02/12/24 1408 02/12/24 1444   02/12/24 1500  ceFEPIme  (MAXIPIME ) 2 g in sodium chloride  0.9 % 100 mL IVPB        2 g 200 mL/hr over 30 Minutes Intravenous Every 8 hours 02/12/24 1414          Patient was given sequential compression devices, early ambulation, and chemoprophylaxis to prevent DVT.  Inpatient Morphine Milligram Equivalents Per Day 8/7 - 8/11   Values displayed are in units of MME/Day    Order Start / End Date 8/7 8/8 8/9 Yesterday Today    oxyCODONE  (Oxy IR/ROXICODONE ) immediate release tablet 5 mg 8/8 - 8/8 -- 0 of Unknown -- -- --    oxyCODONE  (ROXICODONE ) 5 MG/5ML solution 5 mg 8/8 - 8/8 -- 0 of Unknown -- -- --       Group total: 0 of Unknown       oxyCODONE  (Oxy IR/ROXICODONE ) immediate release tablet 5-10 mg  8/7 - 8/8 15 of 22.5-45 30 of 37.5-75 -- -- --    fentaNYL  (SUBLIMAZE ) injection 25-50 mcg 8/8 - 8/8 -- 0 of 45-90 -- -- --    fentaNYL  (SUBLIMAZE ) injection 8/8 - 8/8 -- *60 of 60 -- -- --    HYDROmorphone  (DILAUDID ) injection 8/8 - 8/8 -- *40 of 40 -- -- --    HYDROmorphone  (DILAUDID ) injection 0.5-1 mg 8/8 - No end date -- 20 of 10-20 80 of 60-120 40 of 60-120 40 of 60-120    oxyCODONE  (Oxy IR/ROXICODONE ) immediate release tablet 5-10 mg 8/8 - No end date -- 15 of 7.5-15 30 of 45-90 45 of 45-90 15 of 45-90    traMADol  (ULTRAM ) tablet 50 mg 8/9 - No end date -- 5 of 0 20 of 20 15 of 20 10 of 20    Daily Totals  15 of 22.5-45 * 170 of Unknown (at least 200-300) 130 of 125-230 100 of 125-230 65 of 125-230  *One-Step medication  Calculation Errors     Order Type Date Details   oxyCODONE  (Oxy  IR/ROXICODONE ) immediate release tablet 5 mg Ordered Dose -- Insufficient frequency information   oxyCODONE  (ROXICODONE ) 5 MG/5ML solution 5 mg Ordered Dose -- Insufficient frequency information            Patient benefited maximally from hospital stay and there were no complications.    Recent vital signs: Patient Vitals for the past 24 hrs:  BP Temp Temp src Pulse Resp SpO2  02/16/24 0528 114/68 98 F (36.7 C) Oral 61 17 96 %  02/15/24 2120 129/75 98.2 F (36.8 C) Oral 75 17 99 %  02/15/24 1316 (!) 141/85 98.1 F (36.7 C) Oral 81 18 98 %     Recent laboratory studies:  Recent Labs    02/14/24 0353 02/15/24 0952  WBC  --  10.3  HGB  --  11.4*  HCT  --  37.1*  PLT  --  PLATELET CLUMPS NOTED ON SMEAR, UNABLE TO ESTIMATE  NA 138  --   K 4.8  --   CL 101  --   CO2 24  --   BUN 13  --   CREATININE 0.77  --   GLUCOSE 199*  --   CALCIUM 9.2  --      Discharge Medications:   Allergies as of 02/16/2024       Reactions   Topiramate Nausea Only        Medication List     STOP taking these medications    cefadroxil 500 MG capsule Commonly known as: DURICEF   ibuprofen  600 MG tablet Commonly known as: ADVIL    levofloxacin 500 MG tablet Commonly known as: LEVAQUIN   meloxicam 15 MG tablet Commonly known as: MOBIC   predniSONE 20 MG tablet Commonly known as: DELTASONE   sulfamethoxazole-trimethoprim 800-160 MG tablet Commonly known as: BACTRIM DS   traMADol  50 MG tablet Commonly known as: ULTRAM        TAKE these medications    aspirin  EC 81 MG tablet Take 1 tablet (81 mg total) by mouth 2 (two) times daily.   ceFEPime  IVPB Commonly known as: MAXIPIME  Inject 2 g into the vein every 8 (eight) hours. Indication:  Septic arthritis of right knee First Dose: Yes Last Day of Therapy:  03/26/24 Labs - Once weekly:  CBC/D and BMP, Labs - Once weekly: ESR and CRP Method of administration: IV Push Method of administration may be changed at the  discretion of  home infusion pharmacist based upon assessment of the patient and/or caregiver's ability to self-administer the medication ordered.   daptomycin  IVPB Commonly known as: CUBICIN  Inject 900 mg into the vein daily. Indication:  Septic arthritis of right knee First Dose: Yes Last Day of Therapy:  03/26/24 Labs - Once weekly:  CBC/D, BMP, and CPK Labs - Once weekly: ESR and CRP Method of administration: IV Push Method of administration may be changed at the discretion of home infusion pharmacist based upon assessment of the patient and/or caregiver's ability to self-administer the medication ordered.   hydrOXYzine  25 MG tablet Commonly known as: ATARAX  Take 25 mg by mouth 3 (three) times daily.   metaxalone 800 MG tablet Commonly known as: SKELAXIN Take 800 mg by mouth 3 (three) times daily as needed for muscle spasms.   oxyCODONE -acetaminophen  5-325 MG tablet Commonly known as: PERCOCET/ROXICET Take 1 tablet by mouth every 4 (four) hours as needed for severe pain (pain score 7-10). What changed:  when to take this reasons to take this               Discharge Care Instructions  (From admission, onward)           Start     Ordered   02/16/24 0000  Change dressing on IV access line weekly and PRN  (Home infusion instructions - [facility] )        02/16/24 0956   02/16/24 0000  Weight bearing as tolerated        02/16/24 1225            Diagnostic Studies: US  EKG SITE RITE Result Date: 02/16/2024 If Site Rite image not attached, placement could not be confirmed due to current cardiac rhythm.  US  EKG SITE RITE Result Date: 02/13/2024 If Site Rite image not attached, placement could not be confirmed due to current cardiac rhythm.   Disposition: Discharge disposition: 01-Home or Self Care       Discharge Instructions     [facility] pharmacist to adjust dose for Vancomycin , Aminoglycosides and other anti-infective therapies  as requested by physician.   Complete by: As directed    [facility] to provide Cath Flo 2mg    Complete by: As directed    Administer for PICC line occlusion and as ordered by physician for other access device issues.   Anaphylaxis Kit: Provided to treat any anaphylactic reaction to the medication being provided to the patient if First Dose or when requested by physician   Complete by: As directed    Epinephrine  1mg /ml vial / amp: Administer 0.3mg  (0.51ml) subcutaneously once for moderate to severe anaphylaxis, nurse to call physician and pharmacy when reaction occurs and call 911 if needed for immediate care   Diphenhydramine 50mg /ml IV vial: Administer 25-50mg  IV/IM PRN for first dose reaction, rash, itching, mild reaction, nurse to call physician and pharmacy when reaction occurs   Sodium Chloride  0.9% NS 500ml IV: Administer if needed for hypovolemic blood pressure drop or as ordered by physician after call to physician with anaphylactic reaction   Call MD / Call 911   Complete by: As directed    If you experience chest pain or shortness of breath, CALL 911 and be transported to the hospital emergency room.  If you develope a fever above 101 F, pus (white drainage) or increased drainage or redness at the wound, or calf pain, call your surgeon's office.   Change dressing on IV access line weekly and PRN  Complete by: As directed    Constipation Prevention   Complete by: As directed    Drink plenty of fluids.  Prune juice may be helpful.  You may use a stool softener, such as Colace (over the counter) 100 mg twice a day.  Use MiraLax (over the counter) for constipation as needed.   Diet - low sodium heart healthy   Complete by: As directed    Driving restrictions   Complete by: As directed    No driving for 2 weeks   Flush IV access with Sodium Chloride  0.9% and Heparin  10 units/ml or 100 units/ml   Complete by: As directed    Home infusion instructions - [facility]    Complete by: As directed    Instructions: Flush IV access with Sodium Chloride  0.9% and Heparin  10units/ml or 100units/ml   Change dressing on IV access line: Weekly and PRN   Instructions Cath Flo 2mg : Administer for PICC Line occlusion and as ordered by physician for other access device   [facility] pharmacist to adjust dose for: Vancomycin , Aminoglycosides and other anti-infective therapies as requested by physician   Increase activity slowly as tolerated   Complete by: As directed    Method of administration may be changed at the discretion of home infusion pharmacist based upon assessment of the patient and/or caregiver's ability to self-administer the medication ordered   Complete by: As directed    Patient may shower   Complete by: As directed    You may shower without a dressing once there is no drainage.  Do not wash over the wound.  If drainage remains, cover wound with plastic wrap and then shower.   Post-operative opioid taper instructions:   Complete by: As directed    POST-OPERATIVE OPIOID TAPER INSTRUCTIONS: It is important to wean off of your opioid medication as soon as possible. If you do not need pain medication after your surgery it is ok to stop day one. Opioids include: Codeine, Hydrocodone(Norco, Vicodin), Oxycodone (Percocet, oxycontin ) and hydromorphone  amongst others.  Long term and even short term use of opiods can cause: Increased pain response Dependence Constipation Depression Respiratory depression And more.  Withdrawal symptoms can include Flu like symptoms Nausea, vomiting And more Techniques to manage these symptoms Hydrate well Eat regular healthy meals Stay active Use relaxation techniques(deep breathing, meditating, yoga) Do Not substitute Alcohol to help with tapering If you have been on opioids for less than two weeks and do not have pain than it is ok to stop all together.  Plan to wean off of opioids This plan should start  within one week post op of your joint replacement. Maintain the same interval or time between taking each dose and first decrease the dose.  Cut the total daily intake of opioids by one tablet each day Next start to increase the time between doses. The last dose that should be eliminated is the evening dose.      Weight bearing as tolerated   Complete by: As directed         Follow-up Information     Yvone Rush, MD Follow up in 3 day(s).   Specialty: Orthopedic Surgery Contact information: [address] [place] [address] [phone]                  Signed: [name] 02/16/2024, 12:28 PM

## 2024-02-16 NOTE — Progress Notes (Signed)
 [facility] Infectious Diseases Follow Up Note  Patient Identification: Patient Name: Garrett Costa MRN: [id number] Admit Date: 02/12/2024  1:31 PM Age: 33 y.o.Today's Date: 02/16/2024  Reason for Visit: Right knee septic arthritis  Principal Problem:   Septic arthritis of knee, right (HCC) Active Problems:   S/P reconstruction of ACL of right knee using hamstring autograft   Retained orthopedic hardware   Antibiotics: Vancomycin  8/7- Cefepime  8/7-  Lines/Hardwares:   Interval Events: Remains afebrile, no labs today   Assessment 33 year old male with prior history of rt knee ACL tear repair with hamstring allograft admitted with  # Right knee septic arthritis -On p.o. antibiotics since 01/22/2024, initially with cefadroxil for 15 days followed by levofloxacin and trimethoprim/sulfamethoxazole - 7/28 rt knee aspirate and cx ( media), no organism in gram stain and negative cx; WBC 44K  - 8/7 appears to haved rt knee aspiration done at Ortho office with concerns for viral arthrosis ( no results available to review) - 8/8 arthroscopic I&D with 3 compartment synovectomy; I&D of proximal right tibial abscess that went down to the Endobutton on the tibial tunnel. Per Or note, ACL graft was intact and 1+ laxity probing; grayish purulent material+, button was left in place. OR cx staph lugdunensis, staph epidermidis, negative AFB stain and fungal stain.  - Per Ortho PA today  No growth from our office obtained 8/7. Allograft not removed. It is a hamstring allograft with endobutton made from stainless or titanium.   Recommendations - Patient is cleared for discharge from orthopedics.  Will place PICC line.  Change antibiotics to daptomycin  and cefepime   pending maturation of cultures.  Plan for 6 weeks of IV antibiotics from OR date with possible transition to p.o. due to retained allograft.  - Will follow or cultures to completion, otherwise  ID will sign off.   OPAT  Diagnosis: RT knee septic arthritis with concerns for osteomyelitis   Culture Result: staph lugdunensis, staph epidermidis, not finalized yet,   Allergies  Allergen Reactions   Topiramate Nausea Only    OPAT Orders Discharge antibiotics to be given via PICC line Discharge antibiotics:Daptomycin  900 mg IV every 24 hours + Cefepime  2 gm IV Q 8 hours ( was on levofloxacin OP> cefepime  IP prior to OR) Per pharmacy protocol  Duration: 6 weeks End Date: 03/26/24  [facility] Care Per Protocol:  Home health RN for IV administration and teaching; PICC line care and labs.    Labs weekly while on IV antibiotics: X__ CBC with differential __ BMP X__ CMP X__ CRP X__ ESR __ Vancomycin  trough X__ CK  __ Please pull PIC at completion of IV antibiotics X__ Please leave PIC in place until doctor has seen patient or been notified  Fax weekly labs to [phone]  Clinic Follow Up Appt: 9/2 at 3: 30 pm   Rest of the management as per the primary team. Thank you for the consult. Please page with pertinent questions or concerns.  ______________________________________________________________________ Subjective patient seen and examined at the bedside.  No concerns, asking about PICC line placement, eager to go home.   Past Medical History:  Diagnosis Date   Migraines    Past Surgical History:  Procedure Laterality Date   KNEE ARTHROSCOPY Right 02/13/2024   Procedure: ARTHROSCOPY, KNEE;  Surgeon: Liam Lerner, MD;  Location: [facility];  Service: Orthopedics;  Laterality: Right;  Arthroscopy, Irrigation and Debridement of Right knee, Possible Removal Of Infected Graft   KNEE SURGERY      Vitals BP  114/68 (BP Location: Left Arm)   Pulse 61   Temp 98 F (36.7 C) (Oral)   Resp 17   Ht 6' 2 (1.88 m)   Wt (!) 142.9 kg   SpO2 96%   BMI 40.44 kg/m     Physical Exam Constitutional: Adult male lying in the bed, NAD    Comments: HEENT WNL  Cardiovascular:      Rate and Rhythm: Normal rate and regular rhythm.     Heart sounds: S1 and S2  Pulmonary:     Effort: Pulmonary effort is normal.     Comments: Normal breath sounds  Abdominal:     Palpations: Abdomen is soft.     Tenderness: Nondistended and nontender  Musculoskeletal:        General: Right knee and leg covered with a dressing C/D/I  Skin:    Comments: No rashes  Neurological:     General: Awake, alert and oriented, grossly nonfocal  Psychiatric:        Mood and Affect: Mood normal.   Pertinent Microbiology Results for orders placed or performed during the hospital encounter of 02/12/24  Culture, blood (Routine X 2) w Reflex to ID Panel     Status: None (Preliminary result)   Collection Time: 02/12/24  5:24 PM   Specimen: BLOOD RIGHT ARM  Result Value Ref Range Status   Specimen Description   Final    BLOOD RIGHT ARM Performed at [facility], [address], [place], [place] [address]    Special Requests   Final    BOTTLES DRAWN AEROBIC AND ANAEROBIC Blood Culture adequate volume Performed at [facility], [address], [place], [place] [address]    Culture   Final    NO GROWTH 4 DAYS Performed at [facility], [address] [place], [place] [address]    Report Status PENDING  Incomplete  Culture, blood (Routine X 2) w Reflex to ID Panel     Status: None (Preliminary result)   Collection Time: 02/12/24  5:33 PM   Specimen: BLOOD LEFT ARM  Result Value Ref Range Status   Specimen Description BLOOD LEFT ARM  Final   Special Requests   Final    BOTTLES DRAWN AEROBIC ONLY Blood Culture adequate volume   Culture   Final    NO GROWTH 4 DAYS Performed at [facility], [address] [place], [place] [address]    Report Status PENDING  Incomplete  Aerobic/Anaerobic Culture w Gram Stain (surgical/deep wound)     Status: None (Preliminary result)   Collection Time: 02/13/24  6:30 PM   Specimen: Path fluid; Body Fluid  Result  Value Ref Range Status   Specimen Description   Final    FLUID Performed at [facility], [address], [place], [place] [address]    Special Requests   Final    FLUID Performed at [facility], [address] [place], [place], [place] [address]    Gram Stain   Final    FEW WBC PRESENT, PREDOMINANTLY PMN NO ORGANISMS SEEN    Culture   Final    NO GROWTH 1 DAY Performed at [facility], [address] [place], [place] [address]    Report Status PENDING  Incomplete  Acid Fast Smear (AFB)     Status: None   Collection Time: 02/13/24  6:30 PM   Specimen: Path fluid; Body Fluid  Result Value Ref Range Status  AFB Specimen Processing Concentration  Final   Acid Fast Smear Negative  Final    Comment: (NOTE) Performed At: [facility] [address] [place], [place] [id number] Jennette Shorter MD Ey:[id number]    Source (AFB) FLUID  Final    Comment: Performed at [facility], [address] [place], [place] [address]  Aerobic/Anaerobic Culture w Gram Stain (surgical/deep wound)     Status: None (Preliminary result)   Collection Time: 02/13/24  6:58 PM   Specimen: Path fluid; Body Fluid  Result Value Ref Range Status   Specimen Description   Final    WOUND Performed at [facility], [address] [place], [place] [address]    Special Requests   Final    WOUND Performed at [facility], [address], [place], [place] [address]    Gram Stain   Final    RARE WBC PRESENT, PREDOMINANTLY PMN NO ORGANISMS SEEN    Culture   Final    CULTURE REINCUBATED FOR BETTER GROWTH Performed at [facility], [address], [place], [place] [address]    Report Status PENDING  Incomplete  Acid Fast Smear (AFB)     Status: None   Collection Time: 02/13/24  6:58 PM   Specimen: Path fluid; Body Fluid  Result Value Ref Range Status   AFB Specimen Processing Concentration  Final    Acid Fast Smear Negative  Final    Comment: (NOTE) Performed At: [facility] [address] [place], [place] [id number] Jennette Shorter MD Ey:[id number]    Source (AFB) WOUND  Final    Comment: Performed at [facility], [address], [place], [place] [address]   Pertinent Lab.    Latest Ref Rng & Units 02/15/2024    9:52 AM 02/12/2024    2:09 PM  CBC  WBC 4.0 - 10.5 K/uL 10.3  9.1   Hemoglobin 13.0 - 17.0 g/dL 88.5  87.9   Hematocrit 39.0 - 52.0 % 37.1  37.5   Platelets 150 - 400 K/uL PLATELET CLUMPS NOTED ON SMEAR, UNABLE TO ESTIMATE  533       Latest Ref Rng & Units 02/14/2024    3:53 AM 02/12/2024    2:09 PM  CMP  Glucose 70 - 99 mg/dL 800  95   BUN 6 - 20 mg/dL 13  14   Creatinine 9.38 - 1.24 mg/dL 9.22  9.07   Sodium 864 - 145 mmol/L 138  140   Potassium 3.5 - 5.1 mmol/L 4.8  3.9   Chloride 98 - 111 mmol/L 101  103   CO2 22 - 32 mmol/L 24  25   Calcium 8.9 - 10.3 mg/dL 9.2  9.2   Total Protein 6.5 - 8.1 g/dL  8.1   Total Bilirubin 0.0 - 1.2 mg/dL  0.7   Alkaline Phos 38 - 126 U/L  124   AST 15 - 41 U/L  36   ALT 0 - 44 U/L  56      Pertinent Imaging today Plain films and CT images have been personally visualized and interpreted; radiology reports have been reviewed. Decision making incorporated into the Impression /   US  EKG SITE RITE Result Date: 02/13/2024 If Site Rite image not attached, placement could not be confirmed due to current cardiac rhythm.  I spent 51 minutes involved in face-to-face and non-face-to-face activities for this patient on the day of the visit. Professional time spent includes the following  activities: Preparing to see the patient (review of tests), Obtaining and reviewing separately obtained history (notes from orthopedics, Dr. Searcy, OR note), Performing a medically appropriate examination and evaluation , Ordering medications/labs, referring and communicating with other health care professionals including  orthopedics, Documenting clinical information in the EMR, Independently interpreting results (not separately reported), Communicating results to the patient, Counseling and educating the patient and Care coordination (not separately reported).   Plan d/w requesting provider as well as ID pharm D  Of note, portions of this note may have been created with voice recognition software. While this note has been edited for accuracy, occasional wrong-word or 'sound-a-like' substitutions may have occurred due to the inherent limitations of voice recognition software.   Electronically signed by:   Annalee Orem, MD Infectious Disease Physician [facility] Pager: [phone]

## 2024-02-16 NOTE — Plan of Care (Signed)
   Problem: Coping: Goal: Level of anxiety will decrease Outcome: Progressing   Problem: Pain Managment: Goal: General experience of comfort will improve and/or be controlled Outcome: Progressing

## 2024-02-16 NOTE — Progress Notes (Signed)
 Peripherally Inserted Central Catheter Placement  The IV Nurse has discussed with the patient and/or persons authorized to consent for the patient, the purpose of this procedure and the potential benefits and risks involved with this procedure.  The benefits include less needle sticks, lab draws from the catheter, and the patient may be discharged home with the catheter. Risks include, but not limited to, infection, bleeding, blood clot (thrombus formation), and puncture of an artery; nerve damage and irregular heartbeat and possibility to perform a PICC exchange if needed/ordered by physician.  Alternatives to this procedure were also discussed.  Bard Power PICC patient education guide, fact sheet on infection prevention and patient information card has been provided to patient /or left at bedside.    PICC Placement Documentation  PICC Single Lumen 02/16/24 Left Basilic 48 cm 2 cm (Active)  Indication for Insertion or Continuance of Line Home intravenous therapies (PICC only) 02/16/24 1309  Exposed Catheter (cm) 2 cm 02/16/24 1309  Site Assessment Clean, Dry, Intact 02/16/24 1309  Line Status Flushed;Saline locked;Blood return noted 02/16/24 1309  Dressing Type Transparent;Securing device 02/16/24 1309  Dressing Status Antimicrobial disc/dressing in place;Clean, Dry, Intact 02/16/24 1309  Line Care Connections checked and tightened 02/16/24 1309  Line Adjustment (NICU/IV Team Only) No 02/16/24 1309  Dressing Intervention New dressing;Adhesive placed at insertion site (IV team only) 02/16/24 1309  Dressing Change Due 02/23/24 02/16/24 1309       Garrett Costa [name] 02/16/2024, 1:10 PM

## 2024-02-17 LAB — CULTURE, BLOOD (ROUTINE X 2)
Culture: NO GROWTH
Culture: NO GROWTH
Special Requests: ADEQUATE
Special Requests: ADEQUATE

## 2024-02-17 MED ORDER — ALTEPLASE 2 MG IJ SOLR
2.0000 mg | Freq: Once | INTRAMUSCULAR | Status: AC
  Administered 2024-02-17 (×2): 2 mg
  Filled 2024-02-17: qty 2

## 2024-02-17 NOTE — Progress Notes (Signed)
 Subjective: 4 Days Post-Op Procedure(s) (LRB): ARTHROSCOPY, KNEE (Right) Patient reports pain as mild.    Objective: Vital signs in last 24 hours: Temp:  [97.6 F (36.4 C)-98.3 F (36.8 C)] 97.6 F (36.4 C) (08/12 0411) Pulse Rate:  [73-90] 73 (08/12 0411) Resp:  [16-17] 17 (08/12 0411) BP: (124-146)/(77-92) 124/77 (08/12 0411) SpO2:  [96 %-98 %] 98 % (08/12 0411)  Intake/Output from previous day: 08/11 0701 - 08/12 0700 In: 608 [P.O.:240; IV Piggyback:368] Out: 925 [Urine:925] Intake/Output this shift: No intake/output data recorded.  Recent Labs    02/15/24 0952  HGB 11.4*   Recent Labs    02/15/24 0952  WBC 10.3  RBC 3.88*  HCT 37.1*  PLT PLATELET CLUMPS NOTED ON SMEAR, UNABLE TO ESTIMATE   No results for input(s): NA, K, CL, CO2, BUN, CREATININE, GLUCOSE, CALCIUM in the last 72 hours. No results for input(s): LABPT, INR in the last 72 hours.  Neurologically intact ABD soft Neurovascular intact Sensation intact distally Intact pulses distally No cellulitis present Compartment soft   Assessment/Plan: 4 Days Post-Op Procedure(s) (LRB): ARTHROSCOPY, KNEE (Right) Advance diet Up with therapy Discharge home with home health Follow up Norleen Gavel 7days     Norleen LITTIE Gavel 02/17/2024, 7:47 AM

## 2024-02-17 NOTE — Plan of Care (Signed)

## 2024-02-17 NOTE — TOC Transition Note (Signed)
 Transition of Care [facility]) - Discharge Note   Patient Details  Name: Garrett Costa MRN: [id number] Date of Birth: 04-03-1991  [facility] CM/SW Contact:  NORMAN ASPEN, LCSW Phone Number: 02/17/2024, 11:23 AM   Clinical Narrative:     Have spoken with pt and ortho PA yesterday and today to confirm dc arrangements.  Have spoken with WC CM, Lynwood Daring as well.  Anticipate dc this afternoon once financial documents between [facility] and [employer] are completed.  Pt plans to use his crutches for mobility.  [facility] will provide both abx and HHRN coverage.  Have updated unit RN.  No further CM needs - will sign off.  Final next level of care: Home w Home Health Services Barriers to Discharge: Barriers Resolved   Patient Goals and CMS Choice Patient states their goals for this hospitalization and ongoing recovery are:: return home          Discharge Placement                       Discharge Plan and Services Additional resources added to the After Visit Summary for                  DME Arranged: N/A DME Agency: NA       HH Arranged: IV Antibiotics, RN HH Agency: [facility] Date HH Agency Contacted: 02/13/24   Representative spoke with at [facility] Agency: Holley Herring, RN  Social Drivers of Health (SDOH) Interventions SDOH Screenings   Food Insecurity: No Food Insecurity (02/12/2024)  Housing: Unknown (02/12/2024)  Transportation Needs: No Transportation Needs (02/12/2024)  Utilities: Not At Risk (02/12/2024)  Financial Resource Strain: Low Risk  (03/04/2023)   Received from [facility]  Social Connections: Unknown (11/20/2021)   Received from [facility]  Tobacco Use: Medium Risk (02/13/2024)     Readmission Risk Interventions    02/13/2024    3:38 PM  Readmission Risk Prevention Plan  Post Dischage Appt Complete  Medication Screening Complete  Transportation Screening Complete

## 2024-02-18 DIAGNOSIS — B958 Unspecified staphylococcus as the cause of diseases classified elsewhere: Secondary | ICD-10-CM

## 2024-02-18 MED ORDER — CEFAZOLIN IV (FOR PTA / DISCHARGE USE ONLY): 2.0000 g | Freq: Three times a day (TID) | INTRAVENOUS | 0 refills | Status: AC

## 2024-02-18 MED ORDER — HEPARIN SOD (PORK) LOCK FLUSH 100 UNIT/ML IV SOLN
250.0000 [IU] | INTRAVENOUS | Status: AC | PRN
  Administered 2024-02-18 (×2): 250 [IU]

## 2024-02-18 MED ORDER — CEFAZOLIN SODIUM-DEXTROSE 2-4 GM/100ML-% IV SOLN
2.0000 g | Freq: Three times a day (TID) | INTRAVENOUS | Status: DC
  Administered 2024-02-18 (×4): 2 g via INTRAVENOUS
  Filled 2024-02-18 (×2): qty 100

## 2024-02-18 NOTE — Plan of Care (Incomplete)
  Problem: Clinical Measurements: Goal: Respiratory complications will improve Outcome: Progressing Goal: Cardiovascular complication will be avoided Outcome: Progressing   Problem: Activity: Goal: Risk for activity intolerance will decrease Outcome: Progressing   Problem: Nutrition: Goal: Adequate nutrition will be maintained Outcome: Progressing   Problem: Coping: Goal: Level of anxiety will decrease Outcome: Progressing   Problem: Elimination: Goal: Will not experience complications related to urinary retention Outcome: Progressing   Problem: Pain Managment: Goal: General experience of comfort will improve and/or be controlled Outcome: Progressing   Problem: Safety: Goal: Ability to remain free from injury will improve Outcome: Progressing   Problem: Skin Integrity: Goal: Risk for impaired skin integrity will decrease Outcome: Progressing

## 2024-02-18 NOTE — Progress Notes (Signed)
 PHARMACY CONSULT NOTE FOR:  OUTPATIENT  PARENTERAL ANTIBIOTIC THERAPY (OPAT)  Indication: septic arthritis of right knee Regimen: Cefazolin  2 gm IV Q 8 hours  End date: 03/26/24   IV antibiotic discharge orders are pended. To discharging provider:  please sign these orders via discharge navigator,  Select New Orders & click on the button choice - Manage This Unsigned Work.     Thank you for allowing pharmacy to be a part of this patient's care.  Damien Quiet, PharmD, BCPS, BCIDP Infectious Diseases Clinical Pharmacist Phone: [phone] 02-29-2024, 8:04 AM

## 2024-02-18 NOTE — Progress Notes (Addendum)
 [facility] Infectious Diseases Follow Up Note  Patient Identification: Patient Name: Garrett Costa MRN: [id number] Admit Date: 02/12/2024  1:31 PM Age: 33 y.o.Today's Date: 02-26-2024  Reason for Visit: Right knee septic arthritis, follow-up on cultures  Principal Problem:   Septic arthritis of knee, right [facility]) Active Problems:   S/P reconstruction of ACL of right knee using hamstring autograft   Retained orthopedic hardware   Antibiotics: Vancomycin  8/7-8/10, daptomycin  8/11- Cefepime  8/8-8/12  Interval Events: Remains afebrile  Assessment 33 year old male with prior history of rt knee ACL tear repair with hamstring allograft admitted with  # Right knee septic arthritis -On p.o. antibiotics since 01/22/2024, initially with cefadroxil for 15 days followed by levofloxacin and trimethoprim/sulfamethoxazole - 7/28 rt knee aspirate and cx ( media), no organism in gram stain and negative cx; WBC 44K  - 8/7 appears to haved rt knee aspiration done at Ortho office with concerns for viral arthrosis, no growth per verbal report - 8/8 arthroscopic I&D with 3 compartment synovectomy; I&D of proximal right tibial abscess that went down to the Endobutton on the tibial tunnel. Per Or note, ACL graft was intact and 1+ laxity probing; grayish purulent material+, button was left in place. OR cx staph lugdunensis, staph epidermidis, negative AFB stain and fungal stain.  - Per Ortho PA 8/11   No growth from our office obtained 8/7. Allograft not removed. It is a hamstring allograft with endobutton made from stainless or titanium.   Recommendations - Will change IV antibiotics to cefazolin  only and update OPAT. Will not add Rifampin.  - ID will so, recall back with questions or concerns.   OPAT  Diagnosis: RT knee septic arthritis with concerns for osteomyelitis   Culture Result: staph lugdunensis, staph epidermidis, not finalized yet,    Allergies  Allergen Reactions   Topiramate Nausea Only    OPAT Orders Discharge antibiotics to be given via PICC line Discharge antibiotics: Cefazolin  2 g IV Q8 Per pharmacy protocol  Duration: 6 weeks End Date: 03/26/24  [facility] Care Per Protocol:  Home health RN for IV administration and teaching; PICC line care and labs.    Labs weekly while on IV antibiotics: X__ CBC with differential __ BMP X__ CMP X__ CRP X__ ESR __ Vancomycin  trough _ CK  __ Please pull PIC at completion of IV antibiotics X__ Please leave PIC in place until doctor has seen patient or been notified  Fax weekly labs to [phone]  Clinic Follow Up Appt: 9/2 at 3: 30 pm   Rest of the management as per the primary team. Thank you for the consult. Please page with pertinent questions or concerns.  ______________________________________________________________________ Subjective patient seen and examined at the bedside. Having breakfast, no complaints.    Past Medical History:  Diagnosis Date   Migraines    Past Surgical History:  Procedure Laterality Date   KNEE ARTHROSCOPY Right 02/13/2024   Procedure: ARTHROSCOPY, KNEE;  Surgeon: Liam Lerner, MD;  Location: [facility];  Service: Orthopedics;  Laterality: Right;  Arthroscopy, Irrigation and Debridement of Right knee, Possible Removal Of Infected Graft   KNEE SURGERY      Vitals BP 116/78 (BP Location: Right Arm)   Pulse 79   Temp 98.6 F (37 C) (Oral)   Resp 17   Ht 6' 2 (1.88 m)   Wt (!) 142.9 kg   SpO2 95%   BMI 40.44 kg/m     Physical Exam Constitutional: Adult male lying in the bed, NAD  Comments: HEENT WNL  Cardiovascular:     Rate and Rhythm: Normal rate and regular rhythm.     Heart sounds:  Pulmonary:     Effort: Pulmonary effort is normal.     Comments:   Abdominal:     Palpations: Abdomen is soft    Tenderness: Nondistended  Musculoskeletal:        General: Right knee and leg covered with a dressing  C/D/I  Skin:    Comments: No rashes  Neurological:     General: Awake, alert and oriented, grossly nonfocal  Psychiatric:        Mood and Affect: Mood normal.   Pertinent Microbiology Results for orders placed or performed during the hospital encounter of 02/12/24  Culture, blood (Routine X 2) w Reflex to ID Panel     Status: None   Collection Time: 02/12/24  5:24 PM   Specimen: BLOOD RIGHT ARM  Result Value Ref Range Status   Specimen Description   Final    BLOOD RIGHT ARM Performed at [facility], [address], [place], [place] [address]    Special Requests   Final    BOTTLES DRAWN AEROBIC AND ANAEROBIC Blood Culture adequate volume Performed at [facility], [address] [place], [place] [address]    Culture   Final    NO GROWTH 5 DAYS Performed at [facility], [address] [place], [place] [address]    Report Status 02/17/2024 FINAL  Final  Culture, blood (Routine X 2) w Reflex to ID Panel     Status: None   Collection Time: 02/12/24  5:33 PM   Specimen: BLOOD LEFT ARM  Result Value Ref Range Status   Specimen Description BLOOD LEFT ARM  Final   Special Requests   Final    BOTTLES DRAWN AEROBIC ONLY Blood Culture adequate volume   Culture   Final    NO GROWTH 5 DAYS Performed at [facility], [address], [place], [place] [address]    Report Status 02/17/2024 FINAL  Final  Aerobic/Anaerobic Culture w Gram Stain (surgical/deep wound)     Status: None (Preliminary result)   Collection Time: 02/13/24  6:30 PM   Specimen: Path fluid; Body Fluid  Result Value Ref Range Status   Specimen Description   Final    FLUID Performed at [facility], [address], [place], [place] [address]    Special Requests   Final    FLUID Performed at [facility], [address], [place], [place] [address]    Gram Stain   Final    FEW WBC PRESENT, PREDOMINANTLY PMN NO ORGANISMS SEEN     Culture   Final    NO GROWTH 3 DAYS NO ANAEROBES ISOLATED; CULTURE IN PROGRESS FOR 5 DAYS Performed at [facility], [address], [place], [place] [address]    Report Status PENDING  Incomplete  Fungus Stain     Status: None   Collection Time: 02/13/24  6:30 PM   Specimen: Path fluid; Body Fluid  Result Value Ref Range Status   FUNGUS STAIN Final report  Final    Comment: (NOTE) Performed At: [facility] [address] [place], [place] [id number] Jennette Shorter MD Ey:[id number]    Fungal Source FLUID  Final    Comment: Performed at [facility], [address] [place], [place] [address]  Acid Fast Smear (AFB)     Status:  None   Collection Time: 02/13/24  6:30 PM   Specimen: Path fluid; Body Fluid  Result Value Ref Range Status   AFB Specimen Processing Concentration  Final   Acid Fast Smear Negative  Final    Comment: (NOTE) Performed At: [facility] [address] [place], [place] [id number] Jennette Shorter MD Ey:[id number]    Source (AFB) FLUID  Final    Comment: Performed at [facility], [address] [place], [place] [address]  Fungal Stain reflex     Status: None   Collection Time: 02/13/24  6:30 PM  Result Value Ref Range Status   Fungal stain result 1 Comment  Final    Comment: (NOTE) KOH/Calcofluor preparation:  no fungus observed. Performed At: [facility] [address] [place], [place] [id number] Jennette Shorter MD Ey:[id number]   Aerobic/Anaerobic Culture w Gram Stain (surgical/deep wound)     Status: None (Preliminary result)   Collection Time: 02/13/24  6:58 PM   Specimen: Path fluid; Body Fluid  Result Value Ref Range Status   Specimen Description   Final    WOUND Performed at [facility], [address] [place], [place] [address]    Special Requests   Final    WOUND Performed at [facility], [address], [place], [place] [address]     Gram Stain   Final    RARE WBC PRESENT, PREDOMINANTLY PMN NO ORGANISMS SEEN Performed at [facility], [address] [place], [place] [address]    Culture   Final    RARE STAPHYLOCOCCUS LUGDUNENSIS RARE STAPHYLOCOCCUS EPIDERMIDIS NO ANAEROBES ISOLATED; CULTURE IN PROGRESS FOR 5 DAYS    Report Status PENDING  Incomplete   Organism ID, Bacteria STAPHYLOCOCCUS LUGDUNENSIS  Final   Organism ID, Bacteria STAPHYLOCOCCUS EPIDERMIDIS  Final      Susceptibility   Staphylococcus epidermidis - MIC*    CIPROFLOXACIN <=0.5 SENSITIVE Sensitive     ERYTHROMYCIN <=0.25 SENSITIVE Sensitive     GENTAMICIN <=0.5 SENSITIVE Sensitive     OXACILLIN <=0.25 SENSITIVE Sensitive     TETRACYCLINE <=1 SENSITIVE Sensitive     VANCOMYCIN  2 SENSITIVE Sensitive     TRIMETH/SULFA <=10 SENSITIVE Sensitive     CLINDAMYCIN <=0.25 SENSITIVE Sensitive     RIFAMPIN <=0.5 SENSITIVE Sensitive     Inducible Clindamycin NEGATIVE Sensitive     * RARE STAPHYLOCOCCUS EPIDERMIDIS   Staphylococcus lugdunensis - MIC*    CIPROFLOXACIN <=0.5 SENSITIVE Sensitive     ERYTHROMYCIN <=0.25 SENSITIVE Sensitive     GENTAMICIN <=0.5 SENSITIVE Sensitive     OXACILLIN 1 SENSITIVE Sensitive     TETRACYCLINE <=1 SENSITIVE Sensitive     VANCOMYCIN  <=0.5 SENSITIVE Sensitive     TRIMETH/SULFA <=10 SENSITIVE Sensitive     CLINDAMYCIN <=0.25 SENSITIVE Sensitive     RIFAMPIN <=0.5 SENSITIVE Sensitive     Inducible Clindamycin NEGATIVE Sensitive     * RARE STAPHYLOCOCCUS LUGDUNENSIS  Acid Fast Smear (AFB)     Status: None   Collection Time: 02/13/24  6:58 PM   Specimen: Path fluid; Body Fluid  Result Value Ref Range Status   AFB Specimen Processing Concentration  Final   Acid Fast Smear Negative  Final    Comment: (NOTE) Performed At: [facility] [address] [place], [place] [id number] Jennette Shorter MD Ey:[id number]    Source (AFB) WOUND  Final    Comment: Performed at [facility], [address], [place], [place] [address]  Pertinent Lab.    Latest Ref Rng & Units 02/15/2024    9:52 AM 02/12/2024    2:09 PM  CBC  WBC 4.0 - 10.5 K/uL 10.3  9.1   Hemoglobin 13.0 - 17.0 g/dL 88.5  87.9   Hematocrit 39.0 - 52.0 % 37.1  37.5   Platelets 150 - 400 K/uL PLATELET CLUMPS NOTED ON SMEAR, UNABLE TO ESTIMATE  533       Latest Ref Rng & Units 02/14/2024    3:53 AM 02/12/2024    2:09 PM  CMP  Glucose 70 - 99 mg/dL 800  95   BUN 6 - 20 mg/dL 13  14   Creatinine 9.38 - 1.24 mg/dL 9.22  9.07   Sodium 864 - 145 mmol/L 138  140   Potassium 3.5 - 5.1 mmol/L 4.8  3.9   Chloride 98 - 111 mmol/L 101  103   CO2 22 - 32 mmol/L 24  25   Calcium 8.9 - 10.3 mg/dL 9.2  9.2   Total Protein 6.5 - 8.1 g/dL  8.1   Total Bilirubin 0.0 - 1.2 mg/dL  0.7   Alkaline Phos 38 - 126 U/L  124   AST 15 - 41 U/L  36   ALT 0 - 44 U/L  56      Pertinent Imaging today Plain films and CT images have been personally visualized and interpreted; radiology reports have been reviewed. Decision making incorporated into the Impression /   US  EKG SITE RITE Result Date: 02/16/2024 If Site Rite image not attached, placement could not be confirmed due to current cardiac rhythm.  US  EKG SITE RITE Result Date: 02/13/2024 If Site Rite image not attached, placement could not be confirmed due to current cardiac rhythm.  I spent 50 minutes involved in face-to-face and non-face-to-face activities for this patient on the day of the visit. Professional time spent includes the following activities: Preparing to see the patient (review of tests), Obtaining and reviewing separately obtained history (notes from orthopedics), Performing a medically appropriate examination and evaluation , Ordering medications/labs, referring and communicating with other health care professionals including orthopedics, Documenting clinical information in the EMR, Independently interpreting results (not separately reported), Communicating results to the  patient, Counseling and educating the patient and Care coordination (not separately reported).   Plan d/w requesting provider as well as ID pharm D  Of note, portions of this note may have been created with voice recognition software. While this note has been edited for accuracy, occasional wrong-word or 'sound-a-like' substitutions may have occurred due to the inherent limitations of voice recognition software.   Electronically signed by:   Annalee Orem, MD Infectious Disease Physician [facility] for Infectious Disease Pager: [phone]

## 2024-02-18 NOTE — Progress Notes (Addendum)
 [facility] Infectious Diseases Follow Up Note  Patient Identification: Patient Name: Garrett Costa MRN: [id number] Admit Date: 02/12/2024  1:31 PM Age: 33 y.o.Today's Date: 02/25/24  Reason for Visit: Right knee septic arthritis, follow-up on cultures  Principal Problem:   Septic arthritis of knee, right [employer]) Active Problems:   S/P reconstruction of ACL of right knee using hamstring autograft   Retained orthopedic hardware   Antibiotics: Vancomycin  8/7-8/10, daptomycin  8/11- Cefepime  8/8-8/12  Interval Events: Remains afebrile  Assessment 33 year old male with prior history of rt knee ACL tear repair with hamstring allograft admitted with  # Right knee septic arthritis -On p.o. antibiotics since 01/22/2024, initially with cefadroxil for 15 days followed by levofloxacin and trimethoprim/sulfamethoxazole - 7/28 rt knee aspirate and cx ( media), no organism in gram stain and negative cx; WBC 44K  - 8/7 appears to haved rt knee aspiration done at Ortho office with concerns for viral arthrosis, no growth per verbal report - 8/8 arthroscopic I&D with 3 compartment synovectomy; I&D of proximal right tibial abscess that went down to the Endobutton on the tibial tunnel. Per Or note, ACL graft was intact and 1+ laxity probing; grayish purulent material+, button was left in place. OR cx staph lugdunensis, staph epidermidis, negative AFB stain and fungal stain.  - Per Ortho PA 8/11   No growth from our office obtained 8/7. Allograft not removed. It is a hamstring allograft with endobutton made from stainless or titanium.   Recommendations - Will change IV antibiotics to cefazolin  only and update OPAT. Will not add Rifampin.  - ID will so, recall back with questions or concerns.   OPAT  Diagnosis: RT knee septic arthritis with concerns for osteomyelitis   Culture Result: staph lugdunensis, staph epidermidis, not finalized yet,    Allergies  Allergen Reactions   Topiramate Nausea Only    OPAT Orders Discharge antibiotics to be given via PICC line Discharge antibiotics: Cefazolin  2 g IV Q8 Per pharmacy protocol  Duration: 6 weeks End Date: 03/26/24  [facility] Care Per Protocol:  Home health RN for IV administration and teaching; PICC line care and labs.    Labs weekly while on IV antibiotics: X__ CBC with differential __ BMP X__ CMP X__ CRP X__ ESR __ Vancomycin  trough _ CK  __ Please pull PIC at completion of IV antibiotics X__ Please leave PIC in place until doctor has seen patient or been notified  Fax weekly labs to [phone]  Clinic Follow Up Appt: 9/2 at 3: 30 pm   Rest of the management as per the primary team. Thank you for the consult. Please page with pertinent questions or concerns.  ______________________________________________________________________ Subjective patient seen and examined at the bedside. Having breakfast, no complaints.    Past Medical History:  Diagnosis Date   Migraines    Past Surgical History:  Procedure Laterality Date   KNEE ARTHROSCOPY Right 02/13/2024   Procedure: ARTHROSCOPY, KNEE;  Surgeon: Liam Lerner, MD;  Location: [facility];  Service: Orthopedics;  Laterality: Right;  Arthroscopy, Irrigation and Debridement of Right knee, Possible Removal Of Infected Graft   KNEE SURGERY      Vitals BP 116/78 (BP Location: Right Arm)   Pulse 79   Temp 98.6 F (37 C) (Oral)   Resp 17   Ht 6' 2 (1.88 m)   Wt (!) 142.9 kg   SpO2 95%   BMI 40.44 kg/m     Physical Exam Constitutional: Adult male lying in the bed, NAD  Comments: HEENT WNL  Cardiovascular:     Rate and Rhythm: Normal rate and regular rhythm.     Heart sounds:  Pulmonary:     Effort: Pulmonary effort is normal.     Comments:   Abdominal:     Palpations: Abdomen is soft    Tenderness: Nondistended  Musculoskeletal:        General: Right knee and leg covered with a dressing  C/D/I  Skin:    Comments: No rashes  Neurological:     General: Awake, alert and oriented, grossly nonfocal  Psychiatric:        Mood and Affect: Mood normal.   Pertinent Microbiology Results for orders placed or performed during the hospital encounter of 02/12/24  Culture, blood (Routine X 2) w Reflex to ID Panel     Status: None   Collection Time: 02/12/24  5:24 PM   Specimen: BLOOD RIGHT ARM  Result Value Ref Range Status   Specimen Description   Final    BLOOD RIGHT ARM Performed at [facility], [address], [place], [place] [address]    Special Requests   Final    BOTTLES DRAWN AEROBIC AND ANAEROBIC Blood Culture adequate volume Performed at [facility], [address] [place], [place] [address]    Culture   Final    NO GROWTH 5 DAYS Performed at [facility], [address] [place], [place] [address]    Report Status 02/17/2024 FINAL  Final  Culture, blood (Routine X 2) w Reflex to ID Panel     Status: None   Collection Time: 02/12/24  5:33 PM   Specimen: BLOOD LEFT ARM  Result Value Ref Range Status   Specimen Description BLOOD LEFT ARM  Final   Special Requests   Final    BOTTLES DRAWN AEROBIC ONLY Blood Culture adequate volume   Culture   Final    NO GROWTH 5 DAYS Performed at [facility], [address], [place], [place] [address]    Report Status 02/17/2024 FINAL  Final  Aerobic/Anaerobic Culture w Gram Stain (surgical/deep wound)     Status: None (Preliminary result)   Collection Time: 02/13/24  6:30 PM   Specimen: Path fluid; Body Fluid  Result Value Ref Range Status   Specimen Description   Final    FLUID Performed at [facility], [address], [place], [place] [address]    Special Requests   Final    FLUID Performed at [facility], [address], [place], [place] [address]    Gram Stain   Final    FEW WBC PRESENT, PREDOMINANTLY PMN NO ORGANISMS SEEN     Culture   Final    NO GROWTH 3 DAYS NO ANAEROBES ISOLATED; CULTURE IN PROGRESS FOR 5 DAYS Performed at [facility], [address], [place], [place] [address]    Report Status PENDING  Incomplete  Fungus Stain     Status: None   Collection Time: 02/13/24  6:30 PM   Specimen: Path fluid; Body Fluid  Result Value Ref Range Status   FUNGUS STAIN Final report  Final    Comment: (NOTE) Performed At: [facility] [address] [place], [place] [id number] Jennette Shorter MD Ey:[id number]    Fungal Source FLUID  Final    Comment: Performed at [facility], [address] [place], [place] [address]  Acid Fast Smear (AFB)     Status:  None   Collection Time: 02/13/24  6:30 PM   Specimen: Path fluid; Body Fluid  Result Value Ref Range Status   AFB Specimen Processing Concentration  Final   Acid Fast Smear Negative  Final    Comment: (NOTE) Performed At: [facility] [address] [place], [place] [id number] Jennette Shorter MD Ey:[id number]    Source (AFB) FLUID  Final    Comment: Performed at [facility], [address] [place], [place] [address]  Fungal Stain reflex     Status: None   Collection Time: 02/13/24  6:30 PM  Result Value Ref Range Status   Fungal stain result 1 Comment  Final    Comment: (NOTE) KOH/Calcofluor preparation:  no fungus observed. Performed At: [facility] [address] [place], [place] [id number] Jennette Shorter MD Ey:[id number]   Aerobic/Anaerobic Culture w Gram Stain (surgical/deep wound)     Status: None (Preliminary result)   Collection Time: 02/13/24  6:58 PM   Specimen: Path fluid; Body Fluid  Result Value Ref Range Status   Specimen Description   Final    WOUND Performed at [facility], [address] [place], [place] [address]    Special Requests   Final    WOUND Performed at [facility], [address], [place], [place] [address]     Gram Stain   Final    RARE WBC PRESENT, PREDOMINANTLY PMN NO ORGANISMS SEEN Performed at [facility], [address] [place], [place] [address]    Culture   Final    RARE STAPHYLOCOCCUS LUGDUNENSIS RARE STAPHYLOCOCCUS EPIDERMIDIS NO ANAEROBES ISOLATED; CULTURE IN PROGRESS FOR 5 DAYS    Report Status PENDING  Incomplete   Organism ID, Bacteria STAPHYLOCOCCUS LUGDUNENSIS  Final   Organism ID, Bacteria STAPHYLOCOCCUS EPIDERMIDIS  Final      Susceptibility   Staphylococcus epidermidis - MIC*    CIPROFLOXACIN <=0.5 SENSITIVE Sensitive     ERYTHROMYCIN <=0.25 SENSITIVE Sensitive     GENTAMICIN <=0.5 SENSITIVE Sensitive     OXACILLIN <=0.25 SENSITIVE Sensitive     TETRACYCLINE <=1 SENSITIVE Sensitive     VANCOMYCIN  2 SENSITIVE Sensitive     TRIMETH/SULFA <=10 SENSITIVE Sensitive     CLINDAMYCIN <=0.25 SENSITIVE Sensitive     RIFAMPIN <=0.5 SENSITIVE Sensitive     Inducible Clindamycin NEGATIVE Sensitive     * RARE STAPHYLOCOCCUS EPIDERMIDIS   Staphylococcus lugdunensis - MIC*    CIPROFLOXACIN <=0.5 SENSITIVE Sensitive     ERYTHROMYCIN <=0.25 SENSITIVE Sensitive     GENTAMICIN <=0.5 SENSITIVE Sensitive     OXACILLIN 1 SENSITIVE Sensitive     TETRACYCLINE <=1 SENSITIVE Sensitive     VANCOMYCIN  <=0.5 SENSITIVE Sensitive     TRIMETH/SULFA <=10 SENSITIVE Sensitive     CLINDAMYCIN <=0.25 SENSITIVE Sensitive     RIFAMPIN <=0.5 SENSITIVE Sensitive     Inducible Clindamycin NEGATIVE Sensitive     * RARE STAPHYLOCOCCUS LUGDUNENSIS  Acid Fast Smear (AFB)     Status: None   Collection Time: 02/13/24  6:58 PM   Specimen: Path fluid; Body Fluid  Result Value Ref Range Status   AFB Specimen Processing Concentration  Final   Acid Fast Smear Negative  Final    Comment: (NOTE) Performed At: [facility] [address] [place], [place] [id number] Jennette Shorter MD Ey:[id number]    Source (AFB) WOUND  Final    Comment: Performed at [facility], [address], [place], [place] [address]  Pertinent Lab.    Latest Ref Rng & Units 02/15/2024    9:52 AM 02/12/2024    2:09 PM  CBC  WBC 4.0 - 10.5 K/uL 10.3  9.1   Hemoglobin 13.0 - 17.0 g/dL 88.5  87.9   Hematocrit 39.0 - 52.0 % 37.1  37.5   Platelets 150 - 400 K/uL PLATELET CLUMPS NOTED ON SMEAR, UNABLE TO ESTIMATE  533       Latest Ref Rng & Units 02/14/2024    3:53 AM 02/12/2024    2:09 PM  CMP  Glucose 70 - 99 mg/dL 800  95   BUN 6 - 20 mg/dL 13  14   Creatinine 9.38 - 1.24 mg/dL 9.22  9.07   Sodium 864 - 145 mmol/L 138  140   Potassium 3.5 - 5.1 mmol/L 4.8  3.9   Chloride 98 - 111 mmol/L 101  103   CO2 22 - 32 mmol/L 24  25   Calcium 8.9 - 10.3 mg/dL 9.2  9.2   Total Protein 6.5 - 8.1 g/dL  8.1   Total Bilirubin 0.0 - 1.2 mg/dL  0.7   Alkaline Phos 38 - 126 U/L  124   AST 15 - 41 U/L  36   ALT 0 - 44 U/L  56      Pertinent Imaging today Plain films and CT images have been personally visualized and interpreted; radiology reports have been reviewed. Decision making incorporated into the Impression /   US  EKG SITE RITE Result Date: 02/16/2024 If Site Rite image not attached, placement could not be confirmed due to current cardiac rhythm.  US  EKG SITE RITE Result Date: 02/13/2024 If Site Rite image not attached, placement could not be confirmed due to current cardiac rhythm.  I spent 50 minutes involved in face-to-face and non-face-to-face activities for this patient on the day of the visit. Professional time spent includes the following activities: Preparing to see the patient (review of tests), Obtaining and reviewing separately obtained history (notes from orthopedics), Performing a medically appropriate examination and evaluation , Ordering medications/labs, referring and communicating with other health care professionals including orthopedics, Documenting clinical information in the EMR, Independently interpreting results (not separately reported), Communicating results to the  patient, Counseling and educating the patient and Care coordination (not separately reported).   Plan d/w requesting provider as well as ID pharm D  Of note, portions of this note may have been created with voice recognition software. While this note has been edited for accuracy, occasional wrong-word or 'sound-a-like' substitutions may have occurred due to the inherent limitations of voice recognition software.   Electronically signed by:   Annalee Orem, MD Infectious Disease Physician [facility] for Infectious Disease Pager: [phone]

## 2024-02-19 LAB — AEROBIC/ANAEROBIC CULTURE W GRAM STAIN (SURGICAL/DEEP WOUND): Culture: NO GROWTH

## 2024-02-19 LAB — ACID FAST SMEAR (AFB, MYCOBACTERIA): Acid Fast Smear: NEGATIVE

## 2024-02-23 ENCOUNTER — Telehealth: Payer: Self-pay

## 2024-02-23 NOTE — Telephone Encounter (Signed)
-----   Message from Annalee Joseph sent at 02/23/2024  8:34 AM EDT ----- Regarding: Fu Triage team  He was just discharged from the hospital for knee infection. Reported feeling warm since discharge and temperature around 99, but not more than 100. However, has 100.5 yesterday and knee is more swollen and warm. No concerns at PICC. He is seeing Orthopedics today at 9: 45 am. Could you please book him for 10: 15 am with me tomorrow ? Please hold that slot for him. Please also let home health have 2 sets of blood cultures drawn.   Thanks.

## 2024-02-23 NOTE — Telephone Encounter (Signed)
 I spoke with patient regarding providers message. Is scheduled for appt tomorrow. Provided with address.  No questions at this time. Lorenda CHRISTELLA Code, RMA

## 2024-02-24 ENCOUNTER — Encounter: Payer: Self-pay | Admitting: Infectious Diseases

## 2024-02-24 ENCOUNTER — Other Ambulatory Visit: Payer: Self-pay

## 2024-02-24 ENCOUNTER — Ambulatory Visit: Payer: Worker's Compensation | Admitting: Infectious Diseases

## 2024-02-24 VITALS — BP 132/92 | HR 94 | Temp 97.6°F | Ht 74.0 in | Wt 309.0 lb

## 2024-02-24 DIAGNOSIS — R509 Fever, unspecified: Secondary | ICD-10-CM

## 2024-02-24 DIAGNOSIS — B957 Other staphylococcus as the cause of diseases classified elsewhere: Secondary | ICD-10-CM

## 2024-02-24 DIAGNOSIS — Z79899 Other long term (current) drug therapy: Secondary | ICD-10-CM | POA: Diagnosis not present

## 2024-02-24 DIAGNOSIS — Z452 Encounter for adjustment and management of vascular access device: Secondary | ICD-10-CM

## 2024-02-24 DIAGNOSIS — M00061 Staphylococcal arthritis, right knee: Secondary | ICD-10-CM

## 2024-02-24 DIAGNOSIS — B958 Unspecified staphylococcus as the cause of diseases classified elsewhere: Secondary | ICD-10-CM

## 2024-02-24 DIAGNOSIS — Z969 Presence of functional implant, unspecified: Secondary | ICD-10-CM | POA: Diagnosis not present

## 2024-02-24 NOTE — Progress Notes (Unsigned)
 Patient Active Problem List   Diagnosis Date Noted   Staphylococcal infection 02/22/24   Septic arthritis of knee, right (HCC) 02/12/2024   S/P reconstruction of ACL of right knee using hamstring autograft 02/12/2024   Retained orthopedic hardware 02/12/2024    Patient's Medications  New Prescriptions   No medications on file  Previous Medications   ASPIRIN  EC 81 MG TABLET    Take 1 tablet (81 mg total) by mouth 2 (two) times daily.   CEFAZOLIN  (ANCEF ) IVPB    Inject 2 g into the vein every 8 (eight) hours. Indication:  Septic arthritis of right knee First Dose: Yes Last Day of Therapy:  03/26/24 Labs - Once weekly:  CBC/D and BMP, Labs - Once weekly: ESR and CRP Method of administration: IV Push Method of administration may be changed at the discretion of home infusion pharmacist based upon assessment of the patient and/or caregiver's ability to self-administer the medication ordered.   HYDROXYZINE  (ATARAX ) 25 MG TABLET    Take 25 mg by mouth 3 (three) times daily.   METAXALONE (SKELAXIN) 800 MG TABLET    Take 800 mg by mouth 3 (three) times daily as needed for muscle spasms.   OXYCODONE -ACETAMINOPHEN  (PERCOCET/ROXICET) 5-325 MG TABLET    Take 1 tablet by mouth every 4 (four) hours as needed for severe pain (pain score 7-10).  Modified Medications   No medications on file  Discontinued Medications   No medications on file    Subjective: Discussed the use of AI scribe software for clinical note transcription with the patient, who gave verbal consent to proceed.   33 year old male with prior history of rt knee ACL tear repair with hamstring allograft who is here for HFU after recent admission 8/7-8/11 for rt knee septic arthritis. Worker's comp case. Discharged on 8/11 to complete 6 weeks of IV cefazolin  through 9/19.  02/24/24 Patient called [facility] office with concerns for fever as well as warmth and swelling in the rt knee. Reports experiencing persistent daily fever since  hospital discharge, with the highest temperature recorded at 100.38F, mostly at 66F. Reports taking tylenol  use round the clock to help manage the fever, except on the night of the highest temperature 100.5. He takes Percocet for pain, which contains Tylenol . Denies pain or discomfort, swelling or tenderness at the PICC line site. He occasionally feels warm but is unsure if it correlates with fever.   Saw Dr Yvone in the Ortho office 8/18 and said rt knee looked OK. He is ambulatory. No other concerns.   Review of Systems: Denies nausea, vomiting or diarrhea. Denies chest pain, cough or SOB. Denies GU symptoms.   Past Medical History:  Diagnosis Date   Migraines    Past Surgical History:  Procedure Laterality Date   KNEE ARTHROSCOPY Right 02/13/2024   Procedure: ARTHROSCOPY, KNEE;  Surgeon: Liam Lerner, MD;  Location: [facility];  Service: Orthopedics;  Laterality: Right;  Arthroscopy, Irrigation and Debridement of Right knee, Possible Removal Of Infected Graft   KNEE SURGERY      Social History   Tobacco Use   Smoking status: Former    Types: Cigarettes   Smokeless tobacco: Never  Substance Use Topics   Alcohol use: Never   Drug use: Never    No family history on file.  Allergies  Allergen Reactions   Topiramate Nausea Only    Health Maintenance  Topic Date Due   HIV Screening  Never done   Hepatitis C Screening  Never done  DTaP/Tdap/Td (1 - Tdap) Never done   Hepatitis B Vaccines 19-59 Average Risk (1 of 3 - 19+ 3-dose series) Never done   HPV VACCINES (1 - 3-dose SCDM series) Never done   COVID-19 Vaccine (1 - 2024-25 season) Never done   INFLUENZA VACCINE  02/06/2024   Pneumococcal Vaccine  Aged Out   Meningococcal B Vaccine  Aged Out    Objective: BP (!) 132/92   Pulse 94   Temp 97.6 F (36.4 C) (Temporal)   Ht 6' 2 (1.88 m)   Wt (!) 309 lb (140.2 kg)   SpO2 96%   BMI 39.67 kg/m    Physical Exam Constitutional:      Appearance: Normal appearance.  Morbidly obese  HENT:     Head: Normocephalic and atraumatic.      Mouth: Mucous membranes are moist.  Eyes:    Conjunctiva/sclera: Conjunctivae normal.     Pupils: Pupils are equal, round, and b/l symmetrical    Cardiovascular:     Rate and Rhythm: Normal rate and regular rhythm.     Heart sounds: s1s2  Pulmonary:     Effort: Pulmonary effort is normal.     Breath sounds: Normal breath sounds.   Abdominal:     General: Non distended     Palpations: soft. Non tender   Musculoskeletal:        General: Normal range of motion. No signs of septic arthritis in peripheral joints.   Rt knee with mild swelling, no significant warmth, tenderness, or erythema, good ROM. Findings expected in post op knee. Unlikely recurrent infection   Skin:    General: Skin is warm and dry.     Comments: PICC with no signs of infection   Neurological:     General: grossly non focal     Mental Status: awake, alert and oriented to person, place, and time.   Psychiatric:        Mood and Affect: Mood normal.   Lab Results Lab Results  Component Value Date   WBC 10.3 02/15/2024   HGB 11.4 (L) 02/15/2024   HCT 37.1 (L) 02/15/2024   MCV 95.6 02/15/2024   PLT PLATELET CLUMPS NOTED ON SMEAR, UNABLE TO ESTIMATE 02/15/2024    Lab Results  Component Value Date   CREATININE 0.77 02/14/2024   BUN 13 02/14/2024   NA 138 02/14/2024   K 4.8 02/14/2024   CL 101 02/14/2024   CO2 24 02/14/2024    Lab Results  Component Value Date   ALT 56 (H) 02/12/2024   AST 36 02/12/2024   ALKPHOS 124 02/12/2024   BILITOT 0.7 02/12/2024    No results found for: CHOL, HDL, LDLCALC, LDLDIRECT, TRIG, CHOLHDL No results found for: LABRPR, RPRTITER No results found for: HIV1RNAQUANT, HIV1RNAVL, CD4TABS   Microbiology Results for orders placed or performed in visit on 02/24/24  Blood culture (routine single)     Status: None (Preliminary result)   Collection Time: 02/24/24 12:00 AM   Specimen:  Blood  Result Value Ref Range Status   MICRO NUMBER: [id number]  Preliminary   SPECIMEN QUALITY: Adequate  Preliminary   Source BLOOD 1  Preliminary   STATUS: PRELIMINARY  Preliminary   Result:   Preliminary    No growth to date. Culture is continuously monitored for a total of 120 hours incubation. A change in status will result in a phone report followed by an updated printed culture report.   COMMENT: Aerobic and anaerobic bottle received.  Preliminary  Blood  culture (routine single)     Status: None (Preliminary result)   Collection Time: 02/24/24 12:00 AM   Specimen: Blood  Result Value Ref Range Status   MICRO NUMBER: [id number]  Preliminary   SPECIMEN QUALITY: Adequate  Preliminary   Source BLOOD 2  Preliminary   STATUS: PRELIMINARY  Preliminary   Result:   Preliminary    No growth to date. Culture is continuously monitored for a total of 120 hours incubation. A change in status will result in a phone report followed by an updated printed culture report.   COMMENT: Aerobic and anaerobic bottle received.  Preliminary   Imaging US  EKG SITE RITE Result Date: 02/16/2024 If Site Rite image not attached, placement could not be confirmed due to current cardiac rhythm.  US  EKG SITE RITE Result Date: 02/13/2024 If Site Rite image not attached, placement could not be confirmed due to current cardiac rhythm.  Assessment/Plan 33 year old male with prior history of rt knee ACL tear repair with hamstring allograft admitted with   # Right knee septic arthritis -On p.o. antibiotics since 01/22/2024, initially with cefadroxil for 15 days followed by levofloxacin and trimethoprim/sulfamethoxazole - 7/28 rt knee aspirate and cx ( media), no organism in gram stain and negative cx; WBC 44K  - 8/7 appears to haved rt knee aspiration done at Ortho office with concerns for viral arthrosis, no growth per verbal report - 8/8 arthroscopic I&D with 3 compartment synovectomy; I&D of proximal right tibial  abscess that went down to the Endobutton on the tibial tunnel. Per Or note, ACL graft was intact and 1+ laxity probing; grayish purulent material+, button was left in place. OR cx staph lugdunensis, staph epidermidis, negative AFB stain and fungal stain.  - Per Ortho PA 8/11   No growth from our office obtained 8/7. Allograft not removed. It is a hamstring allograft with endobutton made from stainless or titanium.   Plan - continue IV cefazolin  as in OPAT, no need to change antibiotics - 2 sets of blood cultures today, CBC, CMP, ESR and CRP today. Can cancel [facility] with [facility] - fu with [facility] on 9/2 as planned  - fu with Orthopedics as instructed   # Fevers - 2 sets of blood cx as above, no new signs of infection    I spent 31 minutes involved in face-to-face and non-face-to-face activities for this patient on the day of the visit. Professional time spent includes the following activities: Preparing to see the patient (review of tests), Obtaining and reviewing separately obtained history (discharge record 8/11), Performing a medically appropriate examination and evaluation, Ordering medications/labs, referring and communicating with other health care professionals, Documenting clinical information in the EMR, Independently interpreting results (not separately reported), Communicating results to the patient/case worker, Counseling and educating the patient and Care coordination (not separately reported).   Of note, portions of this note may have been created with voice recognition software. While this note has been edited for accuracy, occasional wrong-word or 'sound-a-like' substitutions may have occurred due to the inherent limitations of voice recognition software.   Annalee Joseph, MD [facility] 02/24/2024, 10:14 AM

## 2024-02-25 DIAGNOSIS — R509 Fever, unspecified: Secondary | ICD-10-CM | POA: Insufficient documentation

## 2024-02-25 DIAGNOSIS — Z79899 Other long term (current) drug therapy: Secondary | ICD-10-CM | POA: Insufficient documentation

## 2024-02-25 DIAGNOSIS — Z452 Encounter for adjustment and management of vascular access device: Secondary | ICD-10-CM | POA: Insufficient documentation

## 2024-02-27 ENCOUNTER — Ambulatory Visit: Payer: Self-pay | Admitting: Infectious Diseases

## 2024-02-29 LAB — CULTURE, BLOOD (SINGLE)
MICRO NUMBER:: 16852669
MICRO NUMBER:: 16852670
Result:: NO GROWTH
Result:: NO GROWTH
SPECIMEN QUALITY:: ADEQUATE
SPECIMEN QUALITY:: ADEQUATE

## 2024-02-29 LAB — CBC
HCT: 36.6 % — ABNORMAL LOW (ref 38.5–50.0)
Hemoglobin: 12 g/dL — ABNORMAL LOW (ref 13.2–17.1)
MCH: 29.4 pg (ref 27.0–33.0)
MCHC: 32.8 g/dL (ref 32.0–36.0)
MCV: 89.7 fL (ref 80.0–100.0)
MPV: 9.3 fL (ref 7.5–12.5)
Platelets: 497 Thousand/uL — ABNORMAL HIGH (ref 140–400)
RBC: 4.08 Million/uL — ABNORMAL LOW (ref 4.20–5.80)
RDW: 12.4 % (ref 11.0–15.0)
WBC: 10.3 Thousand/uL (ref 3.8–10.8)

## 2024-02-29 LAB — COMPREHENSIVE METABOLIC PANEL WITH GFR
AG Ratio: 1.1 (calc) (ref 1.0–2.5)
ALT: 39 U/L (ref 9–46)
AST: 30 U/L (ref 10–40)
Albumin: 4 g/dL (ref 3.6–5.1)
Alkaline phosphatase (APISO): 130 U/L (ref 36–130)
BUN: 15 mg/dL (ref 7–25)
CO2: 25 mmol/L (ref 20–32)
Calcium: 9.7 mg/dL (ref 8.6–10.3)
Chloride: 103 mmol/L (ref 98–110)
Creat: 0.74 mg/dL (ref 0.60–1.26)
Globulin: 3.6 g/dL (ref 1.9–3.7)
Glucose, Bld: 87 mg/dL (ref 65–99)
Potassium: 4.1 mmol/L (ref 3.5–5.3)
Sodium: 137 mmol/L (ref 135–146)
Total Bilirubin: 0.4 mg/dL (ref 0.2–1.2)
Total Protein: 7.6 g/dL (ref 6.1–8.1)
eGFR: 125 mL/min/1.73m2 (ref >=60)

## 2024-02-29 LAB — C-REACTIVE PROTEIN: CRP: 82.3 mg/L — ABNORMAL HIGH (ref <8.0)

## 2024-02-29 LAB — SEDIMENTATION RATE: Sed Rate: 123 mm/h — ABNORMAL HIGH (ref 0–15)

## 2024-03-08 NOTE — Death Summary Note (Deleted)
 Patient ID: Garrett Costa MRN: [id number] DOB/AGE: [birth year] 32 y.o.  Admit date: 02/12/2024 Discharge date: 2024/03/18  Admission Diagnoses:  Principal Problem:   Septic arthritis of knee, right (HCC) Active Problems:   S/P reconstruction of ACL of right knee using hamstring autograft   Retained orthopedic hardware   Discharge Diagnoses:  Same  Past Medical History:  Diagnosis Date   Migraines     Surgeries: Procedure(s): ARTHROSCOPY, KNEE on 02/13/2024   Consultants:   Discharged Condition: Improved  Hospital Course: Garrett Costa is an 33 y.o. male who was admitted 02/12/2024 for operative treatment ofSeptic arthritis of knee, right (HCC). Patient has severe unremitting pain that affects sleep, daily activities, and work/hobbies. After pre-op clearance the patient was taken to the operating room on 02/13/2024 and underwent  Procedure(s): ARTHROSCOPY, KNEE.    Patient was given perioperative antibiotics:  Anti-infectives (From admission, onward)    Start     Dose/Rate Route Frequency Ordered Stop   03/18/2024 0900  ceFAZolin  (ANCEF ) IVPB 2g/100 mL premix        2 g 200 mL/hr over 30 Minutes Intravenous Every 8 hours 2024-03-18 0804     2024-03-18 0000  ceFAZolin  (ANCEF ) IVPB        2 g Intravenous Every 8 hours Mar 18, 2024 0806 03/26/24 2359   02/16/24 1400  DAPTOmycin  (CUBICIN ) 900 mg in sodium chloride  0.9 % IVPB  Status:  Discontinued        8 mg/kg  106.5 kg (Adjusted) 136 mL/hr over 30 Minutes Intravenous Daily 02/16/24 0953 03-18-24 0804   02/16/24 0000  daptomycin  (CUBICIN ) IVPB  Status:  Discontinued        900 mg Intravenous Every 24 hours 02/16/24 0956 18-Mar-2024    02/16/24 0000  ceFEPime  (MAXIPIME ) IVPB  Status:  Discontinued        2 g Intravenous Every 8 hours 02/16/24 0956 18-Mar-2024    02/14/24 0600  ceFAZolin  (ANCEF ) IVPB 2g/100 mL premix  Status:  Discontinued        2 g 200 mL/hr over 30 Minutes Intravenous On call to O.R. 02/13/24 2027 02/13/24 2038   02/13/24 1400   vancomycin  (VANCOREADY) IVPB 2000 mg/400 mL  Status:  Discontinued        2,000 mg 200 mL/hr over 120 Minutes Intravenous Every 12 hours 02/13/24 0851 02/16/24 0953   02/13/24 0200  vancomycin  (VANCOREADY) IVPB 1500 mg/300 mL  Status:  Discontinued        1,500 mg 150 mL/hr over 120 Minutes Intravenous Every 8 hours 02/12/24 1444 02/13/24 0851   02/12/24 1530  vancomycin  (VANCOCIN ) 2,500 mg in sodium chloride  0.9 % 500 mL IVPB        2,500 mg 262.5 mL/hr over 120 Minutes Intravenous  Once 02/12/24 1444 02/12/24 2250   02/12/24 1500  vancomycin  (VANCOREADY) IVPB 2000 mg/400 mL  Status:  Discontinued        2,000 mg 200 mL/hr over 120 Minutes Intravenous Every 12 hours 02/12/24 1408 02/12/24 1444   02/12/24 1500  ceFEPIme  (MAXIPIME ) 2 g in sodium chloride  0.9 % 100 mL IVPB  Status:  Discontinued        2 g 200 mL/hr over 30 Minutes Intravenous Every 8 hours 02/12/24 1414 18-Mar-2024 0804        Patient was given sequential compression devices, early ambulation, and chemoprophylaxis to prevent DVT.  Inpatient Morphine Milligram Equivalents Per Day 8/7 2024/03/18   Values displayed are in units of MME/Day    Order Start /  End Date 8/7 8/8 8/9 8/10 8/11 Yesterday Today    oxyCODONE  (Oxy IR/ROXICODONE ) immediate release tablet 5 mg 8/8 - 8/8 -- 0 of Unknown -- -- -- -- --    oxyCODONE  (ROXICODONE ) 5 MG/5ML solution 5 mg 8/8 - 8/8 -- 0 of Unknown -- -- -- -- --       Group total: 0 of Unknown         oxyCODONE  (Oxy IR/ROXICODONE ) immediate release tablet 5-10 mg 8/7 - 8/8 15 of 22.5-45 30 of 37.5-75 -- -- -- -- --    fentaNYL  (SUBLIMAZE ) injection 25-50 mcg 8/8 - 8/8 -- 0 of 45-90 -- -- -- -- --    fentaNYL  (SUBLIMAZE ) injection 8/8 - 8/8 -- *60 of 60 -- -- -- -- --    HYDROmorphone  (DILAUDID ) injection 8/8 - 8/8 -- *40 of 40 -- -- -- -- --    HYDROmorphone  (DILAUDID ) injection 0.5-1 mg 8/8 - No end date -- 20 of 10-20 80 of 60-120 40 of 60-120 40 of 60-120 20 of 60-120 0 of 60-120    oxyCODONE  (Oxy  IR/ROXICODONE ) immediate release tablet 5-10 mg 8/8 - No end date -- 15 of 7.5-15 30 of 45-90 45 of 45-90 45 of 45-90 75 of 45-90 30 of 45-90    traMADol  (ULTRAM ) tablet 50 mg 8/9 - No end date -- 5 of 0 20 of 20 15 of 20 15 of 20 20 of 20 5 of 20    Daily Totals  15 of 22.5-45 * 170 of Unknown (at least 200-300) 130 of 125-230 100 of 125-230 100 of 125-230 115 of 125-230 35 of 125-230  *One-Step medication  Calculation Errors     Order Type Date Details   oxyCODONE  (Oxy IR/ROXICODONE ) immediate release tablet 5 mg Ordered Dose -- Insufficient frequency information   oxyCODONE  (ROXICODONE ) 5 MG/5ML solution 5 mg Ordered Dose -- Insufficient frequency information            Patient benefited maximally from hospital stay and there were no complications.    Recent vital signs: Patient Vitals for the past 24 hrs:  BP Temp Temp src Pulse Resp SpO2  02-25-24 0547 116/78 98.6 F (37 C) Oral 79 17 95 %  02/17/24 2101 135/84 99.3 F (37.4 C) Oral 93 16 95 %  02/17/24 1505 136/81 98.5 F (36.9 C) -- 83 17 96 %     Recent laboratory studies:  Recent Labs    02/15/24 0952  WBC 10.3  HGB 11.4*  HCT 37.1*  PLT PLATELET CLUMPS NOTED ON SMEAR, UNABLE TO ESTIMATE     Discharge Medications:   Allergies as of 2024-02-25       Reactions   Topiramate Nausea Only        Medication List     STOP taking these medications    cefadroxil 500 MG capsule Commonly known as: DURICEF   ibuprofen  600 MG tablet Commonly known as: ADVIL    levofloxacin 500 MG tablet Commonly known as: LEVAQUIN   meloxicam 15 MG tablet Commonly known as: MOBIC   predniSONE 20 MG tablet Commonly known as: DELTASONE   sulfamethoxazole-trimethoprim 800-160 MG tablet Commonly known as: BACTRIM DS   traMADol  50 MG tablet Commonly known as: ULTRAM        TAKE these medications    aspirin  EC 81 MG tablet Take 1 tablet (81 mg total) by mouth 2 (two) times daily.   ceFAZolin  IVPB Commonly known as:  ANCEF  Inject 2 g into the vein  every 8 (eight) hours. Indication:  Septic arthritis of right knee First Dose: Yes Last Day of Therapy:  03/26/24 Labs - Once weekly:  CBC/D and BMP, Labs - Once weekly: ESR and CRP Method of administration: IV Push Method of administration may be changed at the discretion of home infusion pharmacist based upon assessment of the patient and/or caregiver's ability to self-administer the medication ordered.   hydrOXYzine  25 MG tablet Commonly known as: ATARAX  Take 25 mg by mouth 3 (three) times daily.   metaxalone 800 MG tablet Commonly known as: SKELAXIN Take 800 mg by mouth 3 (three) times daily as needed for muscle spasms.   oxyCODONE -acetaminophen  5-325 MG tablet Commonly known as: PERCOCET/ROXICET Take 1 tablet by mouth every 4 (four) hours as needed for severe pain (pain score 7-10). What changed:  when to take this reasons to take this               Durable Medical Equipment  (From admission, onward)           Start     Ordered   02/16/24 1244  For home use only DME Walker rolling  Once       Question Answer Comment  Walker: With 5 Inch Wheels   Patient needs a walker to treat with the following condition Chronic infection of right knee (HCC)      02/16/24 1244              Discharge Care Instructions  (From admission, onward)           Start     Ordered   20-Feb-2024 0000  Change dressing on IV access line weekly and PRN  (Home infusion instructions - [facility] )        02/20/24 0806   02-20-24 0000  Weight bearing as tolerated        Feb 20, 2024 0827   02/17/24 0000  Weight bearing as tolerated        02/17/24 0837   02/16/24 0000  Change dressing on IV access line weekly and PRN  (Home infusion instructions - [facility] )        02/16/24 0956   02/16/24 0000  Weight bearing as tolerated        02/16/24 1225            Diagnostic Studies: US  EKG SITE RITE Result Date: 02/16/2024 If  Site Rite image not attached, placement could not be confirmed due to current cardiac rhythm.  US  EKG SITE RITE Result Date: 02/13/2024 If Site Rite image not attached, placement could not be confirmed due to current cardiac rhythm.   Disposition: Discharge disposition: 01-Home or Self Care       Discharge Instructions     [facility] pharmacist to adjust dose for Vancomycin , Aminoglycosides and other anti-infective therapies as requested by physician.   Complete by: As directed    [facility] pharmacist to adjust dose for Vancomycin , Aminoglycosides and other anti-infective therapies as requested by physician.   Complete by: As directed    [facility] to provide Cath Flo 2mg    Complete by: As directed    Administer for PICC line occlusion and as ordered by physician for other access device issues.   [facility] to provide Cath Flo 2mg    Complete by: As directed    Administer for PICC line occlusion and as ordered by physician for other access device issues.   Anaphylaxis Kit: Provided  to treat any anaphylactic reaction to the medication being provided to the patient if First Dose or when requested by physician   Complete by: As directed    Epinephrine  1mg /ml vial / amp: Administer 0.3mg  (0.45ml) subcutaneously once for moderate to severe anaphylaxis, nurse to call physician and pharmacy when reaction occurs and call 911 if needed for immediate care   Diphenhydramine 50mg /ml IV vial: Administer 25-50mg  IV/IM PRN for first dose reaction, rash, itching, mild reaction, nurse to call physician and pharmacy when reaction occurs   Sodium Chloride  0.9% NS 500ml IV: Administer if needed for hypovolemic blood pressure drop or as ordered by physician after call to physician with anaphylactic reaction   Anaphylaxis Kit: Provided to treat any anaphylactic reaction to the medication being provided to the patient if First Dose or when requested by physician    Complete by: As directed    Epinephrine  1mg /ml vial / amp: Administer 0.3mg  (0.6ml) subcutaneously once for moderate to severe anaphylaxis, nurse to call physician and pharmacy when reaction occurs and call 911 if needed for immediate care   Diphenhydramine 50mg /ml IV vial: Administer 25-50mg  IV/IM PRN for first dose reaction, rash, itching, mild reaction, nurse to call physician and pharmacy when reaction occurs   Sodium Chloride  0.9% NS 500ml IV: Administer if needed for hypovolemic blood pressure drop or as ordered by physician after call to physician with anaphylactic reaction   Call MD / Call 911   Complete by: As directed    If you experience chest pain or shortness of breath, CALL 911 and be transported to the hospital emergency room.  If you develope a fever above 101 F, pus (white drainage) or increased drainage or redness at the wound, or calf pain, call your surgeon's office.   Call MD / Call 911   Complete by: As directed    If you experience chest pain or shortness of breath, CALL 911 and be transported to the hospital emergency room.  If you develope a fever above 101 F, pus (white drainage) or increased drainage or redness at the wound, or calf pain, call your surgeon's office.   Call MD / Call 911   Complete by: As directed    If you experience chest pain or shortness of breath, CALL 911 and be transported to the hospital emergency room.  If you develope a fever above 101 F, pus (white drainage) or increased drainage or redness at the wound, or calf pain, call your surgeon's office.   Change dressing on IV access line weekly and PRN   Complete by: As directed    Change dressing on IV access line weekly and PRN   Complete by: As directed    Constipation Prevention   Complete by: As directed    Drink plenty of fluids.  Prune juice may be helpful.  You may use a stool softener, such as Colace (over the counter) 100 mg twice a day.  Use MiraLax (over the counter) for constipation as  needed.   Constipation Prevention   Complete by: As directed    Drink plenty of fluids.  Prune juice may be helpful.  You may use a stool softener, such as Colace (over the counter) 100 mg twice a day.  Use MiraLax (over the counter) for constipation as needed.   Constipation Prevention   Complete by: As directed    Drink plenty of fluids.  Prune juice may be helpful.  You may use a stool softener, such as Colace (over the  counter) 100 mg twice a day.  Use MiraLax (over the counter) for constipation as needed.   Diet - low sodium heart healthy   Complete by: As directed    Driving restrictions   Complete by: As directed    No driving for 2 weeks   Driving restrictions   Complete by: As directed    No driving for 2 weeks   Driving restrictions   Complete by: As directed    No driving for 2 weeks   Flush IV access with Sodium Chloride  0.9% and Heparin  10 units/ml or 100 units/ml   Complete by: As directed    Flush IV access with Sodium Chloride  0.9% and Heparin  10 units/ml or 100 units/ml   Complete by: As directed    Home infusion instructions - [facility]   Complete by: As directed    Instructions: Flush IV access with Sodium Chloride  0.9% and Heparin  10units/ml or 100units/ml   Change dressing on IV access line: Weekly and PRN   Instructions Cath Flo 2mg : Administer for PICC Line occlusion and as ordered by physician for other access device   [facility] pharmacist to adjust dose for: Vancomycin , Aminoglycosides and other anti-infective therapies as requested by physician   Home infusion instructions - [facility]   Complete by: As directed    Instructions: Flush IV access with Sodium Chloride  0.9% and Heparin  10units/ml or 100units/ml   Change dressing on IV access line: Weekly and PRN   Instructions Cath Flo 2mg : Administer for PICC Line occlusion and as ordered by physician for other access device   [facility] pharmacist to adjust  dose for: Vancomycin , Aminoglycosides and other anti-infective therapies as requested by physician   Increase activity slowly as tolerated   Complete by: As directed    Increase activity slowly as tolerated   Complete by: As directed    Increase activity slowly as tolerated   Complete by: As directed    Method of administration may be changed at the discretion of home infusion pharmacist based upon assessment of the patient and/or caregiver's ability to self-administer the medication ordered   Complete by: As directed    Method of administration may be changed at the discretion of home infusion pharmacist based upon assessment of the patient and/or caregiver's ability to self-administer the medication ordered   Complete by: As directed    Patient may shower   Complete by: As directed    You may shower without a dressing once there is no drainage.  Do not wash over the wound.  If drainage remains, cover wound with plastic wrap and then shower.   Patient may shower   Complete by: As directed    You may shower without a dressing once there is no drainage.  Do not wash over the wound.  If drainage remains, cover wound with plastic wrap and then shower.   Patient may shower   Complete by: As directed    You may shower without a dressing once there is no drainage.  Do not wash over the wound.  If drainage remains, cover wound with plastic wrap and then shower.   Post-operative opioid taper instructions:   Complete by: As directed    POST-OPERATIVE OPIOID TAPER INSTRUCTIONS: It is important to wean off of your opioid medication as soon as possible. If you do not need pain medication after your surgery it is ok to stop day one. Opioids include: Codeine, Hydrocodone(Norco, Vicodin), Oxycodone (Percocet, oxycontin ) and  hydromorphone  amongst others.  Long term and even short term use of opiods can cause: Increased pain response Dependence Constipation Depression Respiratory depression And more.   Withdrawal symptoms can include Flu like symptoms Nausea, vomiting And more Techniques to manage these symptoms Hydrate well Eat regular healthy meals Stay active Use relaxation techniques(deep breathing, meditating, yoga) Do Not substitute Alcohol to help with tapering If you have been on opioids for less than two weeks and do not have pain than it is ok to stop all together.  Plan to wean off of opioids This plan should start within one week post op of your joint replacement. Maintain the same interval or time between taking each dose and first decrease the dose.  Cut the total daily intake of opioids by one tablet each day Next start to increase the time between doses. The last dose that should be eliminated is the evening dose.      Post-operative opioid taper instructions:   Complete by: As directed    POST-OPERATIVE OPIOID TAPER INSTRUCTIONS: It is important to wean off of your opioid medication as soon as possible. If you do not need pain medication after your surgery it is ok to stop day one. Opioids include: Codeine, Hydrocodone(Norco, Vicodin), Oxycodone (Percocet, oxycontin ) and hydromorphone  amongst others.  Long term and even short term use of opiods can cause: Increased pain response Dependence Constipation Depression Respiratory depression And more.  Withdrawal symptoms can include Flu like symptoms Nausea, vomiting And more Techniques to manage these symptoms Hydrate well Eat regular healthy meals Stay active Use relaxation techniques(deep breathing, meditating, yoga) Do Not substitute Alcohol to help with tapering If you have been on opioids for less than two weeks and do not have pain than it is ok to stop all together.  Plan to wean off of opioids This plan should start within one week post op of your joint replacement. Maintain the same interval or time between taking each dose and first decrease the dose.  Cut the total daily intake of opioids by  one tablet each day Next start to increase the time between doses. The last dose that should be eliminated is the evening dose.      Post-operative opioid taper instructions:   Complete by: As directed    POST-OPERATIVE OPIOID TAPER INSTRUCTIONS: It is important to wean off of your opioid medication as soon as possible. If you do not need pain medication after your surgery it is ok to stop day one. Opioids include: Codeine, Hydrocodone(Norco, Vicodin), Oxycodone (Percocet, oxycontin ) and hydromorphone  amongst others.  Long term and even short term use of opiods can cause: Increased pain response Dependence Constipation Depression Respiratory depression And more.  Withdrawal symptoms can include Flu like symptoms Nausea, vomiting And more Techniques to manage these symptoms Hydrate well Eat regular healthy meals Stay active Use relaxation techniques(deep breathing, meditating, yoga) Do Not substitute Alcohol to help with tapering If you have been on opioids for less than two weeks and do not have pain than it is ok to stop all together.  Plan to wean off of opioids This plan should start within one week post op of your joint replacement. Maintain the same interval or time between taking each dose and first decrease the dose.  Cut the total daily intake of opioids by one tablet each day Next start to increase the time between doses. The last dose that should be eliminated is the evening dose.      Weight bearing as tolerated  Complete by: As directed    Weight bearing as tolerated   Complete by: As directed    Weight bearing as tolerated   Complete by: As directed         Follow-up Information     Yvone Rush, MD Follow up in 3 day(s).   Specialty: Orthopedic Surgery Contact information: [address] [place] [address] [phone]                  Signed: [name] 2024-03-10, 8:28 AM

## 2024-03-08 DEATH — deceased

## 2024-03-09 ENCOUNTER — Ambulatory Visit: Payer: Self-pay | Admitting: Infectious Diseases

## 2024-03-12 ENCOUNTER — Telehealth: Payer: Self-pay

## 2024-03-12 ENCOUNTER — Other Ambulatory Visit: Payer: Self-pay

## 2024-03-12 ENCOUNTER — Encounter: Payer: Self-pay | Admitting: Infectious Diseases

## 2024-03-12 ENCOUNTER — Ambulatory Visit (INDEPENDENT_AMBULATORY_CARE_PROVIDER_SITE_OTHER): Payer: Worker's Compensation | Admitting: Infectious Diseases

## 2024-03-12 VITALS — BP 137/89 | HR 80 | Temp 98.2°F | Ht 74.0 in | Wt 305.0 lb

## 2024-03-12 DIAGNOSIS — R509 Fever, unspecified: Secondary | ICD-10-CM | POA: Diagnosis not present

## 2024-03-12 DIAGNOSIS — M00061 Staphylococcal arthritis, right knee: Secondary | ICD-10-CM

## 2024-03-12 DIAGNOSIS — Z79899 Other long term (current) drug therapy: Secondary | ICD-10-CM | POA: Diagnosis not present

## 2024-03-12 DIAGNOSIS — Z452 Encounter for adjustment and management of vascular access device: Secondary | ICD-10-CM | POA: Diagnosis not present

## 2024-03-12 MED ORDER — CEFADROXIL 500 MG PO CAPS: 1000.0000 mg | ORAL_CAPSULE | Freq: Two times a day (BID) | ORAL | 2 refills | Status: DC

## 2024-03-12 NOTE — Progress Notes (Signed)
 Patient Active Problem List   Diagnosis Date Noted   Fever 02/25/2024   Medication management 02/25/2024   PICC (peripherally inserted central catheter) in place 02/25/2024   Staphylococcal infection 02/24/2024   Septic arthritis of knee, right (HCC) 02/12/2024   S/P reconstruction of ACL of right knee using hamstring autograft 02/12/2024   Retained orthopedic hardware 02/12/2024    Patient's Medications  New Prescriptions   No medications on file  Previous Medications   ASPIRIN  EC 81 MG TABLET    Take 1 tablet (81 mg total) by mouth 2 (two) times daily.   CEFAZOLIN  (ANCEF ) IVPB    Inject 2 g into the vein every 8 (eight) hours. Indication:  Septic arthritis of right knee First Dose: Yes Last Day of Therapy:  03/26/24 Labs - Once weekly:  CBC/D and BMP, Labs - Once weekly: ESR and CRP Method of administration: IV Push Method of administration may be changed at the discretion of home infusion pharmacist based upon assessment of the patient and/or caregiver's ability to self-administer the medication ordered.   HYDROXYZINE  (ATARAX ) 25 MG TABLET    Take 25 mg by mouth 3 (three) times daily.   METAXALONE (SKELAXIN) 800 MG TABLET    Take 800 mg by mouth 3 (three) times daily as needed for muscle spasms.   OXYCODONE -ACETAMINOPHEN  (PERCOCET/ROXICET) 5-325 MG TABLET    Take 1 tablet by mouth every 4 (four) hours as needed for severe pain (pain score 7-10).  Modified Medications   No medications on file  Discontinued Medications   No medications on file    Subjective: Discussed the use of AI scribe software for clinical note transcription with the patient, who gave verbal consent to proceed.   33 year old male with prior history of rt knee ACL tear repair with hamstring allograft who is here for HFU after recent admission 8/7-8/11 for rt knee septic arthritis. Worker's comp case. Discharged on 8/11 to complete 6 weeks of IV cefazolin  through 9/19.  02/24/24 Patient called [facility] office  with concerns for fever as well as warmth and swelling in the rt knee. Reports experiencing persistent daily fever since hospital discharge, with the highest temperature recorded at 100.56F, mostly at 29F. Reports taking tylenol  use round the clock to help manage the fever, except on the night of the highest temperature 100.5. He takes Percocet for pain, which contains Tylenol . Denies pain or discomfort, swelling or tenderness at the PICC line site. He occasionally feels warm but is unsure if it correlates with fever.   Saw Dr Yvone in the Ortho office 8/18 and said rt knee looked OK. He is ambulatory. No other concerns.   03/12/24 Saw Ortho and an attempt to aspirate the rt knee was unsuccessful due to hematoma. Getting IV cefazolin  without any concerns related to PICC or other concerns like nausea, vomiting, or diarrhea. Denies any pain, swelling, redness in the rt knee. He is ambulatory. He has not experienced any fever in the past 11 or 12 days.  Next fu with Ortho is next Tuesday. No complaints today.   Review of Systems: all systems reviewed with pertinent positives and negatives as listed above   Past Medical History:  Diagnosis Date   Migraines    Past Surgical History:  Procedure Laterality Date   KNEE ARTHROSCOPY Right 02/13/2024   Procedure: ARTHROSCOPY, KNEE;  Surgeon: Liam Lerner, MD;  Location: [facility];  Service: Orthopedics;  Laterality: Right;  Arthroscopy, Irrigation and Debridement of Right knee, Possible Removal Of Infected  Graft   KNEE SURGERY      Social History   Tobacco Use   Smoking status: Former    Types: Cigarettes   Smokeless tobacco: Never  Substance Use Topics   Alcohol use: Never   Drug use: Never    No family history on file.  Allergies  Allergen Reactions   Topiramate Nausea Only    Health Maintenance  Topic Date Due   COVID-19 Vaccine (1) Never done   HIV Screening  Never done   Hepatitis C Screening  Never done   DTaP/Tdap/Td (1 - Tdap) Never  done   Hepatitis B Vaccines 19-59 Average Risk (1 of 3 - 19+ 3-dose series) Never done   HPV VACCINES (1 - 3-dose SCDM series) Never done   Influenza Vaccine  Never done   Pneumococcal Vaccine  Aged Out   Meningococcal B Vaccine  Aged Out    Objective: BP 137/89   Pulse 80   Temp 98.2 F (36.8 C) (Temporal)   Ht 6' 2 (1.88 m)   Wt (!) 305 lb (138.3 kg)   SpO2 97%   BMI 39.16 kg/m     Physical Exam Constitutional:      Appearance: Normal appearance. Morbidly obese  HENT:     Head: Normocephalic and atraumatic.      Mouth: Mucous membranes are moist.  Eyes:    Conjunctiva/sclera: Conjunctivae normal.     Pupils: Pupils are equal, round, and b/l symmetrical    Cardiovascular:     Rate and Rhythm: Normal rate and regular rhythm.     Heart sounds:  Pulmonary:     Effort: Pulmonary effort is normal.     Breath sounds:   Abdominal:     General: Non distended     Palpations:   Musculoskeletal:        General: Normal range of motion. No signs of septic arthritis in peripheral joints.   Rt knee with mild swelling, no significant warmth, tenderness, or erythema, good ROM. Findings expected in post op knee. No signs of active infection    Skin:    General: Skin is warm and dry.     Comments: PICC with no signs of infection   Neurological:     General: grossly non focal     Mental Status: awake, alert and oriented to person, place, and time.   Psychiatric:        Mood and Affect: Mood normal.   Lab Results Lab Results  Component Value Date   WBC 10.3 02/24/2024   HGB 12.0 (L) 02/24/2024   HCT 36.6 (L) 02/24/2024   MCV 89.7 02/24/2024   PLT 497 (H) 02/24/2024    Lab Results  Component Value Date   CREATININE 0.74 02/24/2024   BUN 15 02/24/2024   NA 137 02/24/2024   K 4.1 02/24/2024   CL 103 02/24/2024   CO2 25 02/24/2024    Lab Results  Component Value Date   ALT 39 02/24/2024   AST 30 02/24/2024   ALKPHOS 124 02/12/2024   BILITOT 0.4 02/24/2024     No results found for: CHOL, HDL, LDLCALC, LDLDIRECT, TRIG, CHOLHDL No results found for: LABRPR, RPRTITER No results found for: HIV1RNAQUANT, HIV1RNAVL, CD4TABS   Microbiology Results for orders placed or performed in visit on 02/24/24  Blood culture (routine single)     Status: None   Collection Time: 02/24/24 10:36 AM   Specimen: Blood  Result Value Ref Range Status   MICRO NUMBER: [id number]  Final   SPECIMEN QUALITY: Adequate  Final   Source BLOOD 1  Final   STATUS: FINAL  Final   Result: No growth after 5 days  Final   COMMENT: Aerobic and anaerobic bottle received.  Final  Blood culture (routine single)     Status: None   Collection Time: 02/24/24 10:41 AM   Specimen: Blood  Result Value Ref Range Status   MICRO NUMBER: [id number]  Final   SPECIMEN QUALITY: Adequate  Final   Source BLOOD 2  Final   STATUS: FINAL  Final   Result: No growth after 5 days  Final   COMMENT: Aerobic and anaerobic bottle received.  Final   Imaging US  EKG SITE RITE Result Date: 02/16/2024 If Site Rite image not attached, placement could not be confirmed due to current cardiac rhythm.  US  EKG SITE RITE Result Date: 02/13/2024 If Site Rite image not attached, placement could not be confirmed due to current cardiac rhythm.  Assessment/Plan 33 year old male with prior history of rt knee ACL tear repair with hamstring allograft admitted with   # Right knee septic arthritis -On p.o. antibiotics since 01/22/2024, initially with cefadroxil  for 15 days followed by levofloxacin and trimethoprim/sulfamethoxazole - 7/28 rt knee aspirate and cx ( media), no organism in gram stain and negative cx; WBC 44K  - 8/7 appears to haved rt knee aspiration done at Ortho office with concerns for viral arthrosis, no growth per verbal report - 8/8 arthroscopic I&D with 3 compartment synovectomy; I&D of proximal right tibial abscess that went down to the Endobutton on the tibial tunnel. Per Or note,  ACL graft was intact and 1+ laxity probing; grayish purulent material+, button was left in place. OR cx staph lugdunensis, staph epidermidis, negative AFB stain and fungal stain.  - Per Ortho PA 8/11   No growth from our office obtained 8/7. Allograft not removed. It is a hamstring allograft with endobutton made from stainless or titanium.   Plan - continue IV cefazolin  as in OPAT, EOT 9/19 - Pull PICC after last dose on 9/19 - Start cefadroxil  1g po bid from 9/20, plan for 6 months total - 9/3 labs reviewed and discussed. ESR and CRP improving  - fu in 4 weeks  - fu with Ortho as instructed.   # Medication management  - 9/3 CK 60, ESR 101, CRP 24 - 8/26 CK 49, ESR 117, CRP 86  # Fevers - resolved   # Morbid Obesity  - will benefit from weight loss measures.    I spent 31 minutes involved in face-to-face and non-face-to-face activities for this patient on the day of the visit. Professional time spent includes the following activities: Preparing to see the patient (review of tests), Performing a medically appropriate examination and evaluation, Ordering medications/labs, referring and communicating with other health care professionals including Orthopedics, Documenting clinical information in the EMR, Independently interpreting results (not separately reported), Communicating results to the patient, Counseling and educating the patient and Care coordination (not separately reported).   Of note, portions of this note may have been created with voice recognition software. While this note has been edited for accuracy, occasional wrong-word or 'sound-a-like' substitutions may have occurred due to the inherent limitations of voice recognition software.   Annalee Joseph, MD [facility] 03/12/2024, 8:42 AM

## 2024-03-12 NOTE — Telephone Encounter (Signed)
 Per Dr. Dea end date to pull picc and stop IV abx after last dose is on 9/19. Sent message to [employer] about end date as well.

## 2024-03-19 ENCOUNTER — Encounter: Payer: Self-pay | Admitting: Infectious Diseases

## 2024-03-30 LAB — ACID FAST CULTURE WITH REFLEXED SENSITIVITIES (MYCOBACTERIA)
Acid Fast Culture: NEGATIVE
Acid Fast Culture: NEGATIVE

## 2024-04-02 LAB — ACID FAST CULTURE WITH REFLEXED SENSITIVITIES (MYCOBACTERIA): Acid Fast Culture: NEGATIVE

## 2024-04-05 ENCOUNTER — Other Ambulatory Visit: Payer: Self-pay

## 2024-04-05 ENCOUNTER — Ambulatory Visit (INDEPENDENT_AMBULATORY_CARE_PROVIDER_SITE_OTHER): Payer: Worker's Compensation | Admitting: Infectious Diseases

## 2024-04-05 VITALS — BP 130/86 | HR 77 | Temp 98.1°F | Resp 16 | Wt 325.8 lb

## 2024-04-05 DIAGNOSIS — M00061 Staphylococcal arthritis, right knee: Secondary | ICD-10-CM

## 2024-04-05 DIAGNOSIS — B958 Unspecified staphylococcus as the cause of diseases classified elsewhere: Secondary | ICD-10-CM

## 2024-04-05 DIAGNOSIS — Z969 Presence of functional implant, unspecified: Secondary | ICD-10-CM

## 2024-04-05 DIAGNOSIS — Z79899 Other long term (current) drug therapy: Secondary | ICD-10-CM | POA: Diagnosis not present

## 2024-04-05 NOTE — Progress Notes (Unsigned)
 Patient Active Problem List   Diagnosis Date Noted  . Morbid obesity (HCC) 03/12/2024  . Fever 02/25/2024  . Medication management 02/25/2024  . PICC (peripherally inserted central catheter) in place 02/25/2024  . Staphylococcal infection 02-28-2024  . Septic arthritis of knee, right (HCC) 02/12/2024  . S/P reconstruction of ACL of right knee using hamstring autograft 02/12/2024  . Retained orthopedic hardware 02/12/2024    Patient's Medications  New Prescriptions   No medications on file  Previous Medications   ASPIRIN  EC 81 MG TABLET    Take 1 tablet (81 mg total) by mouth 2 (two) times daily.   CEFADROXIL  (DURICEF) 500 MG CAPSULE    Take 2 capsules (1,000 mg total) by mouth 2 (two) times daily.   HYDROXYZINE  (ATARAX ) 25 MG TABLET    Take 25 mg by mouth 3 (three) times daily.   METAXALONE (SKELAXIN) 800 MG TABLET    Take 800 mg by mouth 3 (three) times daily as needed for muscle spasms.   OXYCODONE -ACETAMINOPHEN  (PERCOCET/ROXICET) 5-325 MG TABLET    Take 1 tablet by mouth every 4 (four) hours as needed for severe pain (pain score 7-10).  Modified Medications   No medications on file  Discontinued Medications   No medications on file    Subjective: Discussed the use of AI scribe software for clinical note transcription with the patient, who gave verbal consent to proceed.   33 year old male with prior history of rt knee ACL tear repair with hamstring allograft who is here for HFU after recent admission 8/7-8/11 for rt knee septic arthritis. Worker's comp case. Discharged on 8/11 to complete 6 weeks of IV cefazolin  through 9/19.  02/24/24 Patient called [facility] office with concerns for fever as well as warmth and swelling in the rt knee. Reports experiencing persistent daily fever since hospital discharge, with the highest temperature recorded at 100.30F, mostly at 26F. Reports taking tylenol  use round the clock to help manage the fever, except on the night of the highest  temperature 100.5. He takes Percocet for pain, which contains Tylenol . Denies pain or discomfort, swelling or tenderness at the PICC line site. He occasionally feels warm but is unsure if it correlates with fever.   Saw Dr Yvone in the Ortho office 8/18 and said rt knee looked OK. He is ambulatory. No other concerns.   03/12/24 Saw Ortho and an attempt to aspirate the rt knee was unsuccessful due to hematoma. Getting IV cefazolin  without any concerns related to PICC or other concerns like nausea, vomiting, or diarrhea. Denies any pain, swelling, redness in the rt knee. He is ambulatory. He has not experienced any fever in the past 11 or 12 days.  Next fu with Ortho is next Tuesday. No complaints today.   9/29 Doing OK, saw ortho twice, next visit in a month. Taking cefadroxil  twice daily bid, no concerns. Started work today, light work.   Review of Systems: all systems reviewed with pertinent positives and negatives as listed above   Past Medical History:  Diagnosis Date  . Migraines    Past Surgical History:  Procedure Laterality Date  . KNEE ARTHROSCOPY Right 02/13/2024   Procedure: ARTHROSCOPY, KNEE;  Surgeon: Liam Lerner, MD;  Location: [facility];  Service: Orthopedics;  Laterality: Right;  Arthroscopy, Irrigation and Debridement of Right knee, Possible Removal Of Infected Graft  . KNEE SURGERY      Social History   Tobacco Use  . Smoking status: Former    Types: Cigarettes  .  Smokeless tobacco: Never  Substance Use Topics  . Alcohol use: Never  . Drug use: Never    No family history on file.  Allergies  Allergen Reactions  . Topiramate Nausea Only    Health Maintenance  Topic Date Due  . COVID-19 Vaccine (1) Never done  . HIV Screening  Never done  . Hepatitis C Screening  Never done  . DTaP/Tdap/Td (1 - Tdap) Never done  . Hepatitis B Vaccines 19-59 Average Risk (1 of 3 - 19+ 3-dose series) Never done  . HPV VACCINES (1 - 3-dose SCDM series) Never done  . Influenza  Vaccine  Never done  . Pneumococcal Vaccine  Aged Out  . Meningococcal B Vaccine  Aged Out    Objective: There were no vitals taken for this visit.    Physical Exam Constitutional:      Appearance: Normal appearance. Morbidly obese  HENT:     Head: Normocephalic and atraumatic.      Mouth: Mucous membranes are moist.  Eyes:    Conjunctiva/sclera: Conjunctivae normal.     Pupils: Pupils are equal, round, and b/l symmetrical    Cardiovascular:     Rate and Rhythm: Normal rate and regular rhythm.     Heart sounds:  Pulmonary:     Effort: Pulmonary effort is normal.     Breath sounds:   Abdominal:     General: Non distended     Palpations:   Musculoskeletal:        General: Normal range of motion. No signs of septic arthritis in peripheral joints.   Rt knee with mild swelling, no significant warmth, tenderness, or erythema, good ROM. Findings expected in post op knee. No signs of active infection    Skin:    General: Skin is warm and dry.     Comments: PICC with no signs of infection   Neurological:     General: grossly non focal     Mental Status: awake, alert and oriented to person, place, and time.   Psychiatric:        Mood and Affect: Mood normal.   Lab Results Lab Results  Component Value Date   WBC 10.3 02/24/2024   HGB 12.0 (L) 02/24/2024   HCT 36.6 (L) 02/24/2024   MCV 89.7 02/24/2024   PLT 497 (H) 02/24/2024    Lab Results  Component Value Date   CREATININE 0.74 02/24/2024   BUN 15 02/24/2024   NA 137 02/24/2024   K 4.1 02/24/2024   CL 103 02/24/2024   CO2 25 02/24/2024    Lab Results  Component Value Date   ALT 39 02/24/2024   AST 30 02/24/2024   ALKPHOS 124 02/12/2024   BILITOT 0.4 02/24/2024    No results found for: CHOL, HDL, LDLCALC, LDLDIRECT, TRIG, CHOLHDL No results found for: LABRPR, RPRTITER No results found for: HIV1RNAQUANT, HIV1RNAVL, CD4TABS   Microbiology Results for orders placed or performed in  visit on 02/24/24  Blood culture (routine single)     Status: None   Collection Time: 02/24/24 10:36 AM   Specimen: Blood  Result Value Ref Range Status   MICRO NUMBER: [id number]  Final   SPECIMEN QUALITY: Adequate  Final   Source BLOOD 1  Final   STATUS: FINAL  Final   Result: No growth after 5 days  Final   COMMENT: Aerobic and anaerobic bottle received.  Final  Blood culture (routine single)     Status: None   Collection Time: 02/24/24 10:41 AM  Specimen: Blood  Result Value Ref Range Status   MICRO NUMBER: [id number]  Final   SPECIMEN QUALITY: Adequate  Final   Source BLOOD 2  Final   STATUS: FINAL  Final   Result: No growth after 5 days  Final   COMMENT: Aerobic and anaerobic bottle received.  Final   Imaging No results found.  Assessment/Plan 33 year old male with prior history of rt knee ACL tear repair with hamstring allograft admitted with   # Right knee septic arthritis -On p.o. antibiotics since 01/22/2024, initially with cefadroxil  for 15 days followed by levofloxacin and trimethoprim/sulfamethoxazole - 7/28 rt knee aspirate and cx ( media), no organism in gram stain and negative cx; WBC 44K  - 8/7 appears to haved rt knee aspiration done at Ortho office with concerns for viral arthrosis, no growth per verbal report - 8/8 arthroscopic I&D with 3 compartment synovectomy; I&D of proximal right tibial abscess that went down to the Endobutton on the tibial tunnel. Per Or note, ACL graft was intact and 1+ laxity probing; grayish purulent material+, button was left in place. OR cx staph lugdunensis, staph epidermidis, negative AFB stain and fungal stain.  - Per Ortho PA 8/11   No growth from our office obtained 8/7. Allograft not removed. It is a hamstring allograft with endobutton made from stainless or titanium.   Plan - continue IV cefazolin  as in OPAT, EOT 9/19 - Pull PICC after last dose on 9/19 - Start cefadroxil  1g po bid from 9/20, plan for 6 months total - 9/3  labs reviewed and discussed. ESR and CRP improving  - fu in 4 weeks  - fu with Ortho as instructed.   # Medication management  - 9/3 CK 60, ESR 101, CRP 24 - 8/26 CK 49, ESR 117, CRP 86  # Fevers - resolved   # Morbid Obesity  - will benefit from weight loss measures.    I spent 31 minutes involved in face-to-face and non-face-to-face activities for this patient on the day of the visit. Professional time spent includes the following activities: Preparing to see the patient (review of tests), Performing a medically appropriate examination and evaluation, Ordering medications/labs, referring and communicating with other health care professionals including Orthopedics, Documenting clinical information in the EMR, Independently interpreting results (not separately reported), Communicating results to the patient, Counseling and educating the patient and Care coordination (not separately reported).   Of note, portions of this note may have been created with voice recognition software. While this note has been edited for accuracy, occasional wrong-word or 'sound-a-like' substitutions may have occurred due to the inherent limitations of voice recognition software.   Annalee Joseph, MD [facility] for Infectious Disease [facility] 04/05/2024, 3:44 PM

## 2024-05-28 ENCOUNTER — Ambulatory Visit: Admitting: Infectious Diseases

## 2024-06-22 ENCOUNTER — Other Ambulatory Visit: Payer: Self-pay

## 2024-06-22 ENCOUNTER — Encounter: Payer: Self-pay | Admitting: Infectious Diseases

## 2024-06-22 ENCOUNTER — Ambulatory Visit: Payer: Worker's Compensation | Admitting: Infectious Diseases

## 2024-06-22 VITALS — BP 141/92 | HR 62 | Temp 98.2°F | Ht 74.0 in | Wt 330.0 lb

## 2024-06-22 DIAGNOSIS — M00061 Staphylococcal arthritis, right knee: Secondary | ICD-10-CM | POA: Diagnosis not present

## 2024-06-22 DIAGNOSIS — Z5181 Encounter for therapeutic drug level monitoring: Secondary | ICD-10-CM

## 2024-06-22 MED ORDER — CEFADROXIL 500 MG PO CAPS: 500.0000 mg | ORAL_CAPSULE | Freq: Two times a day (BID) | ORAL | 0 refills | Status: AC

## 2024-06-22 NOTE — Progress Notes (Signed)
 Patient Active Problem List   Diagnosis Date Noted   Morbid obesity (HCC) 03/12/2024   Fever 02/25/2024   Medication management 02/25/2024   PICC (peripherally inserted central catheter) in place 02/25/2024   Staphylococcal infection 02/19/2024   Septic arthritis of knee, right (HCC) 02/12/2024   S/P reconstruction of ACL of right knee using hamstring autograft 02/12/2024   Retained orthopedic hardware 02/12/2024   Current Outpatient Medications on File Prior to Visit  Medication Sig Dispense Refill   aspirin  EC 81 MG tablet Take 1 tablet (81 mg total) by mouth 2 (two) times daily. (Patient not taking: Reported on 04/05/2024) 60 tablet 0   cefadroxil  (DURICEF) 500 MG capsule Take 2 capsules (1,000 mg total) by mouth 2 (two) times daily. 120 capsule 2   hydrOXYzine  (ATARAX ) 25 MG tablet Take 25 mg by mouth 3 (three) times daily. (Patient not taking: Reported on 04/05/2024)     oxyCODONE -acetaminophen  (PERCOCET/ROXICET) 5-325 MG tablet Take 1 tablet by mouth every 4 (four) hours as needed for severe pain (pain score 7-10). (Patient not taking: Reported on 04/05/2024) 30 tablet 0   No current facility-administered medications on file prior to visit.    Subjective: Discussed the use of AI scribe software for clinical note transcription with the patient, who gave verbal consent to proceed.   33 year old male with prior history of rt knee ACL tear repair with hamstring allograft who is here for HFU after recent admission 8/7-8/11 for rt knee septic arthritis. Worker's comp case. Discharged on 8/11 to complete 6 weeks of IV cefazolin  through 9/19.  02/24/24 Patient called [facility] office with concerns for fever as well as warmth and swelling in the rt knee. Reports experiencing persistent daily fever since hospital discharge, with the highest temperature recorded at 100.41F, mostly at 38F. Reports taking tylenol  use round the clock to help manage the fever, except on the night of the highest  temperature 100.5. He takes Percocet for pain, which contains Tylenol . Denies pain or discomfort, swelling or tenderness at the PICC line site. He occasionally feels warm but is unsure if it correlates with fever.   Saw Dr Yvone in the Ortho office 8/18 and said rt knee looked OK. He is ambulatory. No other concerns.   03/12/24 Saw Ortho and an attempt to aspirate the rt knee was unsuccessful due to hematoma. Getting IV cefazolin  without any concerns related to PICC or other concerns like nausea, vomiting, or diarrhea. Denies any pain, swelling, redness in the rt knee. He is ambulatory. He has not experienced any fever in the past 11 or 12 days.  Next fu with Ortho is next Tuesday. No complaints today.   9/29 Completed IV cefazolin  through 9/20. PICC removed. Following with Orthopedics. Taking cefadroxil  2 tabs twice daily since 9/21 with no concerns. He has started working from today but starting light. No concerns in rt knee. No complaints.   06/22/24 Compliant with p.o. cefadroxil  2 tablets twice a day without missing doses or concern . Saw Dr Yvone 2 weeks aqo and nothing concerning.  He is doing PT. He is ambulatory. No concerns.   Review of Systems: all systems reviewed with pertinent positives and negatives as listed above   Past Medical History:  Diagnosis Date   Migraines    Past Surgical History:  Procedure Laterality Date   KNEE ARTHROSCOPY Right 02/13/2024   Procedure: ARTHROSCOPY, KNEE;  Surgeon: Liam Lerner, MD;  Location: [facility];  Service: Orthopedics;  Laterality: Right;  Arthroscopy, Irrigation and  Debridement of Right knee, Possible Removal Of Infected Graft   KNEE SURGERY      Social History   Tobacco Use   Smoking status: Former    Types: Cigarettes   Smokeless tobacco: Never  Substance Use Topics   Alcohol use: Never   Drug use: Never    No family history on file.  Allergies  Allergen Reactions   Topiramate Nausea Only    Health Maintenance  Topic Date  Due   COVID-19 Vaccine (1) Never done   HIV Screening  Never done   Hepatitis C Screening  Never done   DTaP/Tdap/Td (1 - Tdap) Never done   Hepatitis B Vaccines 19-59 Average Risk (1 of 3 - 19+ 3-dose series) Never done   HPV VACCINES (1 - 3-dose SCDM series) Never done   Influenza Vaccine  Never done   Pneumococcal Vaccine  Aged Out   Meningococcal B Vaccine  Aged Out    Objective: BP (!) 141/92   Pulse 62   Temp 98.2 F (36.8 C) (Temporal)   Ht 6' 2 (1.88 m)   Wt (!) 330 lb (149.7 kg)   SpO2 97%   BMI 42.37 kg/m '  Physical Exam Constitutional:      Appearance: Normal appearance. Morbidly obese  HENT:     Head: Normocephalic and atraumatic.      Mouth: Mucous membranes are moist.  Eyes:    Conjunctiva/sclera: Conjunctivae normal.     Pupils: Pupils are equal, round, and b/l symmetrical    Cardiovascular:     Rate and Rhythm: Normal rate     Heart sounds:  Pulmonary:     Effort: Pulmonary effort is normal.     Breath sounds:   Abdominal:     General: Non distended     Palpations:   Musculoskeletal:        General: Normal range of motion.ambulatory  Rt knee with no signs of infection  Skin:    General: Skin is warm and dry.     Comments: No rashes   Neurological:     General: grossly non focal     Mental Status: awake, alert and oriented to person, place, and time.   Psychiatric:        Mood and Affect: Mood normal.   Lab Results Lab Results  Component Value Date   WBC 10.3 02/24/2024   HGB 12.0 (L) 02/24/2024   HCT 36.6 (L) 02/24/2024   MCV 89.7 02/24/2024   PLT 497 (H) 02/24/2024    Lab Results  Component Value Date   CREATININE 0.74 02/24/2024   BUN 15 02/24/2024   NA 137 02/24/2024   K 4.1 02/24/2024   CL 103 02/24/2024   CO2 25 02/24/2024    Lab Results  Component Value Date   ALT 39 02/24/2024   AST 30 02/24/2024   ALKPHOS 124 02/12/2024   BILITOT 0.4 02/24/2024    No results found for: CHOL, HDL, LDLCALC,  LDLDIRECT, TRIG, CHOLHDL No results found for: LABRPR, RPRTITER No results found for: HIV1RNAQUANT, HIV1RNAVL, CD4TABS   Microbiology Results for orders placed or performed in visit on 02/24/24  Blood culture (routine single)     Status: None   Collection Time: 02/24/24 10:36 AM   Specimen: Blood  Result Value Ref Range Status   MICRO NUMBER: [id number]  Final   SPECIMEN QUALITY: Adequate  Final   Source BLOOD 1  Final   STATUS: FINAL  Final   Result: No growth after 5  days  Final   COMMENT: Aerobic and anaerobic bottle received.  Final  Blood culture (routine single)     Status: None   Collection Time: 02/24/24 10:41 AM   Specimen: Blood  Result Value Ref Range Status   MICRO NUMBER: [id number]  Final   SPECIMEN QUALITY: Adequate  Final   Source BLOOD 2  Final   STATUS: FINAL  Final   Result: No growth after 5 days  Final   COMMENT: Aerobic and anaerobic bottle received.  Final   Imaging No results found.  Assessment/Plan 33 year old male with prior history of rt knee ACL tear repair with hamstring allograft admitted with   # Right knee septic arthritis -On p.o. antibiotics since 01/22/2024, initially with cefadroxil  for 15 days followed by levofloxacin and trimethoprim/sulfamethoxazole - 7/28 rt knee aspirate and cx ( media), no organism in gram stain and negative cx; WBC 44K  - 8/7 appears to haved rt knee aspiration done at Ortho office with concerns for viral arthrosis, no growth per verbal report - 8/8 arthroscopic I&D with 3 compartment synovectomy; I&D of proximal right tibial abscess that went down to the Endobutton on the tibial tunnel. Per Or note, ACL graft was intact and 1+ laxity probing; grayish purulent material+, button was left in place. OR cx staph lugdunensis, staph epidermidis, negative AFB stain and fungal stain.  - Per Ortho PA 8/11   No growth from our office obtained 8/7. Allograft not removed. It is a hamstring allograft with endobutton made  from stainless or titanium.  - s/p IV cefazolin  through 9/19> PO cefadroxil  1g po bid till date   Plan  - Discussed to decrease cefadroxil  to 5 mg p.o. twice daily.  Refill sent - Labs today - Follow-up in 2 months, will likely continue for 12 months through early August 2026 and then stop   I spent 30 minutes involved in face-to-face and non-face-to-face activities for this patient on the day of the visit. Professional time spent includes the following activities: Preparing to see the patient (review of tests), Performing a medically appropriate examination and evaluation, Ordering medications/labs, reviewing orthopedic notes on 12/23, documenting clinical information in the EMR, Independently interpreting results (not separately reported), Communicating results to the patient, Counseling and educating the patient and Care coordination (not separately reported).   Of note, portions of this note may have been created with voice recognition software. While this note has been edited for accuracy, occasional wrong-word or sound-a-like substitutions may have occurred due to the inherent limitations of voice recognition software.   Annalee Joseph, MD [facility] 06/22/2024, 11:09 AM

## 2024-06-23 LAB — CBC
HCT: 46.5 % (ref 39.4–51.1)
Hemoglobin: 15.4 g/dL (ref 13.2–17.1)
MCH: 28.7 pg (ref 27.0–33.0)
MCHC: 33.1 g/dL (ref 31.6–35.4)
MCV: 86.6 fL (ref 81.4–101.7)
MPV: 10.2 fL (ref 7.5–12.5)
Platelets: 291 Thousand/uL (ref 140–400)
RBC: 5.37 Million/uL (ref 4.20–5.80)
RDW: 14.2 % (ref 11.0–15.0)
WBC: 7.2 Thousand/uL (ref 3.8–10.8)

## 2024-06-23 LAB — BASIC METABOLIC PANEL WITH GFR
BUN: 20 mg/dL (ref 7–25)
CO2: 25 mmol/L (ref 20–32)
Calcium: 9.7 mg/dL (ref 8.6–10.3)
Chloride: 104 mmol/L (ref 98–110)
Creat: 0.71 mg/dL (ref 0.60–1.26)
Glucose, Bld: 88 mg/dL (ref 65–99)
Potassium: 4.2 mmol/L (ref 3.5–5.3)
Sodium: 136 mmol/L (ref 135–146)
eGFR: 126 mL/min/1.73m2 (ref >=60)

## 2024-06-23 LAB — SEDIMENTATION RATE: Sed Rate: 6 mm/h (ref 0–15)

## 2024-06-23 LAB — C-REACTIVE PROTEIN: CRP: 4 mg/L (ref <8.0)

## 2024-06-28 ENCOUNTER — Ambulatory Visit: Payer: Self-pay | Admitting: Infectious Diseases

## 2024-08-25 ENCOUNTER — Ambulatory Visit: Payer: Self-pay | Admitting: Infectious Diseases
